# Patient Record
Sex: Male | Born: 1945 | Race: White | Hispanic: No | Marital: Married | Smoking: Never smoker
Health system: Southern US, Community
[De-identification: ages and names within clinical notes are randomized; demographics above are authoritative.]

## PROBLEM LIST (undated history)

## (undated) DIAGNOSIS — I2510 Atherosclerotic heart disease of native coronary artery without angina pectoris: Secondary | ICD-10-CM

## (undated) DIAGNOSIS — E7800 Pure hypercholesterolemia, unspecified: Secondary | ICD-10-CM

## (undated) DIAGNOSIS — N400 Enlarged prostate without lower urinary tract symptoms: Secondary | ICD-10-CM

## (undated) DIAGNOSIS — I4510 Unspecified right bundle-branch block: Secondary | ICD-10-CM

## (undated) DIAGNOSIS — M1990 Unspecified osteoarthritis, unspecified site: Secondary | ICD-10-CM

## (undated) DIAGNOSIS — Z955 Presence of coronary angioplasty implant and graft: Secondary | ICD-10-CM

## (undated) DIAGNOSIS — I519 Heart disease, unspecified: Secondary | ICD-10-CM

## (undated) DIAGNOSIS — C4491 Basal cell carcinoma of skin, unspecified: Secondary | ICD-10-CM

## (undated) DIAGNOSIS — C4492 Squamous cell carcinoma of skin, unspecified: Secondary | ICD-10-CM

## (undated) HISTORY — DX: Unspecified osteoarthritis, unspecified site: M19.90

## (undated) HISTORY — PX: CARDIAC CATHETERIZATION: SHX172

## (undated) HISTORY — DX: Heart disease, unspecified: I51.9

## (undated) HISTORY — DX: Presence of coronary angioplasty implant and graft: Z95.5

## (undated) HISTORY — PX: OTHER SURGICAL HISTORY: SHX169

## (undated) HISTORY — PX: MOHS SURGERY: SHX181

## (undated) HISTORY — PX: COLONOSCOPY: SHX174

## (undated) HISTORY — DX: Pure hypercholesterolemia, unspecified: E78.00

---

## 1990-04-08 HISTORY — PX: BACK SURGERY: SHX140

## 2012-04-08 HISTORY — PX: CATARACT EXTRACTION, BILATERAL: SHX1313

## 2016-01-09 DIAGNOSIS — Z0000 Encounter for general adult medical examination without abnormal findings: Secondary | ICD-10-CM | POA: Diagnosis not present

## 2016-01-09 DIAGNOSIS — Z125 Encounter for screening for malignant neoplasm of prostate: Secondary | ICD-10-CM | POA: Diagnosis not present

## 2016-01-09 DIAGNOSIS — E782 Mixed hyperlipidemia: Secondary | ICD-10-CM | POA: Diagnosis not present

## 2016-01-18 DIAGNOSIS — Z0000 Encounter for general adult medical examination without abnormal findings: Secondary | ICD-10-CM | POA: Diagnosis not present

## 2016-01-18 DIAGNOSIS — Z23 Encounter for immunization: Secondary | ICD-10-CM | POA: Diagnosis not present

## 2017-01-14 DIAGNOSIS — Z0000 Encounter for general adult medical examination without abnormal findings: Secondary | ICD-10-CM | POA: Diagnosis not present

## 2017-01-14 DIAGNOSIS — Z125 Encounter for screening for malignant neoplasm of prostate: Secondary | ICD-10-CM | POA: Diagnosis not present

## 2017-01-14 DIAGNOSIS — E782 Mixed hyperlipidemia: Secondary | ICD-10-CM | POA: Diagnosis not present

## 2017-01-20 DIAGNOSIS — Z125 Encounter for screening for malignant neoplasm of prostate: Secondary | ICD-10-CM | POA: Diagnosis not present

## 2017-01-20 DIAGNOSIS — E782 Mixed hyperlipidemia: Secondary | ICD-10-CM | POA: Diagnosis not present

## 2017-01-20 DIAGNOSIS — Z0000 Encounter for general adult medical examination without abnormal findings: Secondary | ICD-10-CM | POA: Diagnosis not present

## 2017-01-20 DIAGNOSIS — I2510 Atherosclerotic heart disease of native coronary artery without angina pectoris: Secondary | ICD-10-CM | POA: Diagnosis not present

## 2017-01-20 DIAGNOSIS — S0101XA Laceration without foreign body of scalp, initial encounter: Secondary | ICD-10-CM | POA: Diagnosis not present

## 2017-01-20 DIAGNOSIS — Z23 Encounter for immunization: Secondary | ICD-10-CM | POA: Diagnosis not present

## 2017-02-03 DIAGNOSIS — R310 Gross hematuria: Secondary | ICD-10-CM | POA: Diagnosis not present

## 2017-02-03 DIAGNOSIS — R300 Dysuria: Secondary | ICD-10-CM | POA: Diagnosis not present

## 2017-02-03 DIAGNOSIS — C44311 Basal cell carcinoma of skin of nose: Secondary | ICD-10-CM | POA: Diagnosis not present

## 2017-02-03 DIAGNOSIS — N3090 Cystitis, unspecified without hematuria: Secondary | ICD-10-CM | POA: Diagnosis not present

## 2017-02-13 DIAGNOSIS — R310 Gross hematuria: Secondary | ICD-10-CM | POA: Diagnosis not present

## 2017-02-26 DIAGNOSIS — R8290 Unspecified abnormal findings in urine: Secondary | ICD-10-CM | POA: Diagnosis not present

## 2017-03-20 DIAGNOSIS — C4441 Basal cell carcinoma of skin of scalp and neck: Secondary | ICD-10-CM | POA: Diagnosis not present

## 2017-03-20 DIAGNOSIS — L570 Actinic keratosis: Secondary | ICD-10-CM | POA: Diagnosis not present

## 2017-03-20 DIAGNOSIS — D225 Melanocytic nevi of trunk: Secondary | ICD-10-CM | POA: Diagnosis not present

## 2017-03-20 DIAGNOSIS — C4442 Squamous cell carcinoma of skin of scalp and neck: Secondary | ICD-10-CM | POA: Diagnosis not present

## 2017-03-20 DIAGNOSIS — C44311 Basal cell carcinoma of skin of nose: Secondary | ICD-10-CM | POA: Diagnosis not present

## 2017-03-20 DIAGNOSIS — X32XXXA Exposure to sunlight, initial encounter: Secondary | ICD-10-CM | POA: Diagnosis not present

## 2017-03-20 DIAGNOSIS — D485 Neoplasm of uncertain behavior of skin: Secondary | ICD-10-CM | POA: Diagnosis not present

## 2017-04-17 DIAGNOSIS — Z961 Presence of intraocular lens: Secondary | ICD-10-CM | POA: Diagnosis not present

## 2017-04-28 DIAGNOSIS — C4442 Squamous cell carcinoma of skin of scalp and neck: Secondary | ICD-10-CM | POA: Diagnosis not present

## 2017-04-28 DIAGNOSIS — C4441 Basal cell carcinoma of skin of scalp and neck: Secondary | ICD-10-CM | POA: Diagnosis not present

## 2017-04-28 DIAGNOSIS — Z85828 Personal history of other malignant neoplasm of skin: Secondary | ICD-10-CM | POA: Diagnosis not present

## 2017-05-05 DIAGNOSIS — C44321 Squamous cell carcinoma of skin of nose: Secondary | ICD-10-CM | POA: Diagnosis not present

## 2017-05-05 DIAGNOSIS — Z85828 Personal history of other malignant neoplasm of skin: Secondary | ICD-10-CM | POA: Diagnosis not present

## 2017-08-21 DIAGNOSIS — Z85828 Personal history of other malignant neoplasm of skin: Secondary | ICD-10-CM | POA: Diagnosis not present

## 2017-08-21 DIAGNOSIS — X32XXXA Exposure to sunlight, initial encounter: Secondary | ICD-10-CM | POA: Diagnosis not present

## 2017-08-21 DIAGNOSIS — Z08 Encounter for follow-up examination after completed treatment for malignant neoplasm: Secondary | ICD-10-CM | POA: Diagnosis not present

## 2017-08-21 DIAGNOSIS — L821 Other seborrheic keratosis: Secondary | ICD-10-CM | POA: Diagnosis not present

## 2017-08-21 DIAGNOSIS — D1801 Hemangioma of skin and subcutaneous tissue: Secondary | ICD-10-CM | POA: Diagnosis not present

## 2017-08-21 DIAGNOSIS — L570 Actinic keratosis: Secondary | ICD-10-CM | POA: Diagnosis not present

## 2018-01-12 DIAGNOSIS — I2510 Atherosclerotic heart disease of native coronary artery without angina pectoris: Secondary | ICD-10-CM | POA: Diagnosis not present

## 2018-01-12 DIAGNOSIS — E782 Mixed hyperlipidemia: Secondary | ICD-10-CM | POA: Diagnosis not present

## 2018-01-12 DIAGNOSIS — Z125 Encounter for screening for malignant neoplasm of prostate: Secondary | ICD-10-CM | POA: Diagnosis not present

## 2018-01-21 DIAGNOSIS — I2510 Atherosclerotic heart disease of native coronary artery without angina pectoris: Secondary | ICD-10-CM | POA: Diagnosis not present

## 2018-01-21 DIAGNOSIS — Z125 Encounter for screening for malignant neoplasm of prostate: Secondary | ICD-10-CM | POA: Diagnosis not present

## 2018-01-21 DIAGNOSIS — E782 Mixed hyperlipidemia: Secondary | ICD-10-CM | POA: Diagnosis not present

## 2018-01-21 DIAGNOSIS — R319 Hematuria, unspecified: Secondary | ICD-10-CM | POA: Diagnosis not present

## 2018-01-21 DIAGNOSIS — Z23 Encounter for immunization: Secondary | ICD-10-CM | POA: Diagnosis not present

## 2018-01-21 DIAGNOSIS — Z0000 Encounter for general adult medical examination without abnormal findings: Secondary | ICD-10-CM | POA: Diagnosis not present

## 2018-01-21 DIAGNOSIS — R140 Abdominal distension (gaseous): Secondary | ICD-10-CM | POA: Diagnosis not present

## 2018-01-22 ENCOUNTER — Other Ambulatory Visit: Payer: Self-pay | Admitting: Internal Medicine

## 2018-01-22 DIAGNOSIS — R140 Abdominal distension (gaseous): Secondary | ICD-10-CM

## 2018-01-30 ENCOUNTER — Ambulatory Visit
Admission: RE | Admit: 2018-01-30 | Discharge: 2018-01-30 | Disposition: A | Discharge status: Home / Self Care | Payer: PPO | Source: Ambulatory Visit | Attending: Internal Medicine | Admitting: Internal Medicine

## 2018-01-30 DIAGNOSIS — I2510 Atherosclerotic heart disease of native coronary artery without angina pectoris: Secondary | ICD-10-CM | POA: Diagnosis not present

## 2018-01-30 DIAGNOSIS — N281 Cyst of kidney, acquired: Secondary | ICD-10-CM | POA: Insufficient documentation

## 2018-01-30 DIAGNOSIS — K429 Umbilical hernia without obstruction or gangrene: Secondary | ICD-10-CM | POA: Diagnosis not present

## 2018-01-30 DIAGNOSIS — M24851 Other specific joint derangements of right hip, not elsewhere classified: Secondary | ICD-10-CM | POA: Diagnosis not present

## 2018-01-30 DIAGNOSIS — R140 Abdominal distension (gaseous): Secondary | ICD-10-CM | POA: Diagnosis not present

## 2018-01-30 DIAGNOSIS — N400 Enlarged prostate without lower urinary tract symptoms: Secondary | ICD-10-CM | POA: Insufficient documentation

## 2018-01-30 DIAGNOSIS — M47896 Other spondylosis, lumbar region: Secondary | ICD-10-CM | POA: Diagnosis not present

## 2018-01-30 DIAGNOSIS — I700 Atherosclerosis of aorta: Secondary | ICD-10-CM | POA: Diagnosis not present

## 2018-01-30 DIAGNOSIS — M24852 Other specific joint derangements of left hip, not elsewhere classified: Secondary | ICD-10-CM | POA: Diagnosis not present

## 2018-01-30 DIAGNOSIS — K449 Diaphragmatic hernia without obstruction or gangrene: Secondary | ICD-10-CM | POA: Diagnosis not present

## 2018-01-30 DIAGNOSIS — K7689 Other specified diseases of liver: Secondary | ICD-10-CM | POA: Diagnosis not present

## 2018-01-30 HISTORY — DX: Basal cell carcinoma of skin, unspecified: C44.91

## 2018-01-30 HISTORY — DX: Squamous cell carcinoma of skin, unspecified: C44.92

## 2018-01-30 MED ORDER — IOPAMIDOL (ISOVUE-300) INJECTION 61%
100.0000 mL | Freq: Once | INTRAVENOUS | Status: AC | PRN
  Administered 2018-01-30: 100 mL via INTRAVENOUS

## 2018-02-02 ENCOUNTER — Ambulatory Visit: Payer: Self-pay | Admitting: Urology

## 2018-02-09 ENCOUNTER — Encounter: Payer: Self-pay | Admitting: Urology

## 2018-02-09 ENCOUNTER — Ambulatory Visit: Payer: PPO | Admitting: Urology

## 2018-02-09 VITALS — BP 167/96 | Ht 69.0 in | Wt 183.2 lb

## 2018-02-09 DIAGNOSIS — R3129 Other microscopic hematuria: Secondary | ICD-10-CM | POA: Diagnosis not present

## 2018-02-09 LAB — URINALYSIS, COMPLETE
Bilirubin, UA: NEGATIVE
Glucose, UA: NEGATIVE
Ketones, UA: NEGATIVE
Nitrite, UA: NEGATIVE
Protein, UA: NEGATIVE
Specific Gravity, UA: 1.015 (ref 1.005–1.030)
Urobilinogen, Ur: 0.2 mg/dL (ref 0.2–1.0)
pH, UA: 5.5 (ref 5.0–7.5)

## 2018-02-09 LAB — MICROSCOPIC EXAMINATION
Bacteria, UA: NONE SEEN
Epithelial Cells (non renal): NONE SEEN /HPF (ref 0–10)

## 2018-02-09 NOTE — Progress Notes (Signed)
02/09/2018 3:00 PM   Fred Perkins 10-05-1945 [id number]  Referring provider: Rusty Aus, MD [facility] [place], [place] [address]  No chief complaint on file.   HPI: Patient is a 72 -year-old Caucasian male who presents today as a referral from Dr. Emily Filbert for microscopic hematuria.    Patient was found to have microscopic hematuria on 01/12/2018 with 4-10 RBC's/hpf.  Patient does have a prior history of gross hematuria last year that lasted a few days, was given Cipro and abated.   He has had a kidney stone 30 years ago and he spontaneously passed it. He has a history of BPH, currently on tamsulosin.     His brother has a history of stones.  He does not have a family history of malignancies of the genitourinary tract or hematuria.   Today, he is having symptoms of frequent urination and nocturia.  Patient denies any gross hematuria, dysuria or suprapubic/flank pain.  Patient denies any fevers, chills, nausea or vomiting.  His UA today is bland.    Contrast of CT on 01/30/2018 revealed the adrenal glands are normal in appearance.  A 7 millimeter cyst is identified in the midpole region of the kidney. There is no hydronephrosis. The course of the ureters are unremarkable bilaterally. The prostate gland impresses upon the base of the bladder and appears enlarged. Otherwise the bladder is unremarkable.  He is not a smoker.  He was exposed to second hand smoke growing up and as a young adult.  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   He has HTN.  He has a high BMI.     PMH: Past Medical History:  Diagnosis Date  . Arthritis   . H/O heart artery stent   . Heart disease   . High cholesterol   . Skin cancer, basal cell   . Squamous cell skin cancer     Surgical History: MOHS surgery, back surgery, cataract extraction   Home Medications:  Allergies as of 02/09/2018   No Known Allergies       Medication List        Accurate as of 02/09/18  3:00 PM. Always use your most recent med list.          simvastatin 40 MG tablet Commonly known as:  ZOCOR Take 40 mg by mouth daily.   tamsulosin 0.4 MG Caps capsule Commonly known as:  FLOMAX Take 0.4 mg by mouth daily.       Allergies: No Known Allergies  Family History: Family History  Problem Relation Age of Onset  . Dementia Father   . Alzheimer's disease Mother     Social History:  reports that he has never smoked. He has never used smokeless tobacco. His alcohol and drug histories are not on file.  ROS: UROLOGY Frequent Urination?: Yes Hard to postpone urination?: No Burning/pain with urination?: No Get up at night to urinate?: Yes Leakage of urine?: No Urine stream starts and stops?: No Trouble starting stream?: No Do you have to strain to urinate?: No Blood in urine?: No Urinary tract infection?: Yes Sexually transmitted disease?: No Injury to kidneys or bladder?: No Painful intercourse?: No Weak stream?: No Erection problems?: No Penile pain?: No  Gastrointestinal Nausea?: No Vomiting?: No Indigestion/heartburn?: No Diarrhea?: No Constipation?: No  Constitutional Fever: No Night sweats?: No Weight loss?: No Fatigue?: No  Skin Skin rash/lesions?: No Itching?: No  Eyes Blurred vision?: No Double vision?:  No  Ears/Nose/Throat Sore throat?: No Sinus problems?: No  Hematologic/Lymphatic Swollen glands?: No Easy bruising?: No  Cardiovascular Leg swelling?: No Chest pain?: No  Respiratory Cough?: No Shortness of breath?: No  Endocrine Excessive thirst?: No  Musculoskeletal Back pain?: No Joint pain?: No  Neurological Headaches?: No Dizziness?: No  Psychologic Depression?: No Anxiety?: No  Physical Exam: BP (!) 167/96 (BP Location: Left Arm, Patient Position: Sitting, Cuff Size: Normal)   Ht 5\' 9"  (1.753 m)   Wt 183 lb 3.2 oz (83.1 kg)   BMI 27.05 kg/m    Constitutional:  Well nourished. Alert and oriented, No acute distress. HEENT: [place] AT, moist mucus membranes.  Trachea midline, no masses. Cardiovascular: No clubbing, cyanosis, or edema. Respiratory: Normal respiratory effort, no increased work of breathing. GI: Abdomen is soft, non tender, non distended, no abdominal masses. Liver and spleen not palpable.  No hernias appreciated.  Stool sample for occult testing is not indicated.   GU: No CVA tenderness.  No bladder fullness or masses.  Patient with uncircumcised phallus.   Foreskin easily retracted Urethral meatus is patent.  No penile discharge. No penile lesions or rashes. Scrotum without lesions, cysts, rashes and/or edema.  Testicles are located scrotally bilaterally. No masses are appreciated in the testicles. Left and right epididymis are normal. Rectal: Patient with  normal sphincter tone. Anus and perineum without scarring or rashes. No rectal masses are appreciated. Prostate is approximately 60 grams, no nodules are appreciated. Seminal vesicles could not be palpated.   Skin: No rashes, bruises or suspicious lesions. Lymph: No cervical or inguinal adenopathy. Neurologic: Grossly intact, no focal deficits, moving all 4 extremities. Psychiatric: Normal mood and affect.  Laboratory Data: No results found for: WBC, HGB, HCT, MCV, PLT  No results found for: CREATININE  No results found for: PSA  No results found for: TESTOSTERONE  No results found for: HGBA1C  No results found for: TSH  No results found for: CHOL, HDL, CHOLHDL, VLDL, LDLCALC  No results found for: AST No results found for: ALT No components found for: ALKALINEPHOPHATASE No components found for: BILIRUBINTOTAL  No results found for: ESTRADIOL   Urinalysis No results found for: COLORURINE, APPEARANCEUR, LABSPEC, PHURINE, GLUCOSEU, HGBUR, BILIRUBINUR, KETONESUR, PROTEINUR, UROBILINOGEN, NITRITE, LEUKOCYTESUR  Pertinent Imaging: CLINICAL DATA:  Pt states  he went to Dr. Sabra Heck for his yearly physical and his urinalysis was abnormal for WBC. Hx BPH. NKI Hx Basal Cell Skin CA and Squamous Cell Skin CA. No hx surg.  EXAM: CT ABDOMEN AND PELVIS WITH CONTRAST  TECHNIQUE: Multidetector CT imaging of the abdomen and pelvis was performed using the standard protocol following bolus administration of intravenous contrast.  CONTRAST:  179mL ISOVUE-300 IOPAMIDOL (ISOVUE-300) INJECTION 61%  COMPARISON:  None.  FINDINGS: Lower chest: There is minimal bibasilar atelectasis. Coronary artery calcifications are present. Heart size is normal.  Hepatobiliary: Numerous small hepatic cysts are present. Gallbladder is present. Liver density appears normal.  Pancreas: Unremarkable. No pancreatic ductal dilatation or surrounding inflammatory changes.  Spleen: Normal in size without focal abnormality.  Adrenals/Urinary Tract: The adrenal glands are normal in appearance. A 7 millimeter cyst is identified in the midpole region of the kidney. There is no hydronephrosis. The course of the ureters are unremarkable bilaterally. The prostate gland impresses upon the base of the bladder and appears enlarged. Otherwise the bladder is unremarkable.  Stomach/Bowel: The stomach and small bowel loops are normal in appearance. There are numerous colonic diverticula particularly involving the descending and sigmoid segments. There  is no evidence for acute inflammatory change. The appendix is well seen and has a normal appearance.  Vascular/Lymphatic: There is atherosclerotic calcification of the abdominal aorta not associated with aneurysm. Although involved by atherosclerosis, there is vascular opacification of the celiac axis, superior mesenteric artery, and inferior mesenteric artery. Normal appearance of the portal venous system and inferior vena cava.  Reproductive: The prostate is enlarged.  Other: Small paraumbilical fat containing hernia.  There is no ascites.  Musculoskeletal: There are degenerative changes in the LOWER lumbar spine. No suspicious lytic or blastic lesions are identified. Degenerative changes are seen in both hips.  IMPRESSION: 1. No evidence for urinary tract obstruction or suspicious mass. Benign RIGHT renal cyst. 2. Enlarged prostate with impression on the base of the bladder. 3. Coronary artery disease. 4.  Aortic atherosclerosis.  (ICD10-I70.0) 5. Small paraumbilical fat containing hernia. 6. Degenerative changes in the spine and hips. 7. Benign liver cysts.   Electronically Signed   By: Nolon Nations M.D.   On: 01/31/2018 15:45 I have independently reviewed the films and the lower ureters were not captured during delayed study as it is not routinely performed during this type of CT scan   Assessment & Plan:    1. Microscopic hematuria Explained to the patient that there are a number of causes that can be associated with blood in the urine, such as stones, BPH, UTI's, damage to the urinary tract and/or cancer. Explained to the patient that the imaging studies performed on 01/30/2018 are not the recommended studies by the AUA for the work-up of blood in the urine.    The imaging studies that were performed lack the detail of excluding some urological tumors.as the lower ureters were not followed down to the pelvis on the image test.  He is a relatively low risk for any ureteral cancer as he is a non-smoker and there was no hydro-on the CT, but there is a small chance that one could exist.  He could choose to undergo the appropriate study (CTU) or choose to undergo a cystoscopy with bilateral RTG's to complete the hematuria work up. He may also choose to undergo an in office cystoscopy/cytology and then if any abnormalities were found and he needed to be taken to the [facility] could be performed at that time He would like to undergo an in-office cysto at this time - he has an enlarged  prostate and may want to consider a procedure in the future  I described how the cysto is is performed, typically in an office setting with a flexible cystoscope. I described the risks, benefits, and possible side effects, the most common of which is a minor amount of blood in the urine and/or burning which usually resolves in 24 to 48 hours.   The patient had the opportunity to ask questions which were answered. Based upon this discussion, the patient is willing to proceed. Therefore, I've ordered: a CT Urogram and cystoscopy.  The patient will return following all of the above for discussion of the results.  - Urinalysis, Complete - CULTURE, URINE COMPREHENSIVE - BUN+Creat   Return for cystoscopy for hematuria/BOO.  These notes generated with voice recognition software. I apologize for typographical errors.  Zara Council, PA-C  [facility] [address] [place]  [place], [place] [address] [phone]

## 2018-02-10 LAB — BUN+CREAT
BUN/Creatinine Ratio: 10 (ref 10–24)
BUN: 12 mg/dL (ref 8–27)
CREATININE: 1.2 mg/dL (ref 0.76–1.27)
GFR, EST AFRICAN AMERICAN: 69 mL/min/{1.73_m2} (ref >59)
GFR, EST NON AFRICAN AMERICAN: 60 mL/min/{1.73_m2} (ref >59)

## 2018-02-11 LAB — CULTURE, URINE COMPREHENSIVE

## 2018-02-18 ENCOUNTER — Ambulatory Visit: Payer: PPO | Admitting: Urology

## 2018-02-18 ENCOUNTER — Other Ambulatory Visit: Payer: Self-pay

## 2018-02-18 ENCOUNTER — Encounter: Payer: Self-pay | Admitting: Urology

## 2018-02-18 VITALS — BP 164/91 | HR 80 | Wt 180.0 lb

## 2018-02-18 DIAGNOSIS — R3129 Other microscopic hematuria: Secondary | ICD-10-CM | POA: Diagnosis not present

## 2018-02-18 LAB — URINALYSIS, COMPLETE
Bilirubin, UA: NEGATIVE
GLUCOSE, UA: NEGATIVE
Ketones, UA: NEGATIVE
Nitrite, UA: NEGATIVE
Specific Gravity, UA: 1.02 (ref 1.005–1.030)
UUROB: 1 mg/dL (ref 0.2–1.0)
pH, UA: 7 (ref 5.0–7.5)

## 2018-02-18 LAB — MICROSCOPIC EXAMINATION
Bacteria, UA: NONE SEEN
Epithelial Cells (non renal): NONE SEEN /hpf (ref 0–10)

## 2018-02-18 NOTE — Progress Notes (Signed)
   02/18/18  CC:  Chief Complaint  Patient presents with  . Cysto    HPI: Microhematuria, refer to [employer] note of 02/09/2018.  Blood pressure (!) 164/91, pulse 80, weight 180 lb (81.6 kg). NED. A&Ox3.   No respiratory distress   Abd soft, NT, ND Normal phallus with bilateral descended testicles  Cystoscopy Procedure Note  Patient identification was confirmed, informed consent was obtained, and patient was prepped using Betadine solution.  Lidocaine jelly was administered per urethral meatus.     Pre-Procedure: - Inspection reveals a normal caliber urethral meatus.  Procedure: The flexible cystoscope was introduced without difficulty - No urethral strictures/lesions are present. - Moderate lateral lobe enlargement prostate, no median lobe - Mild elevation bladder neck - Bilateral ureteral orifices identified - Bladder mucosa  reveals no ulcers, tumors, or lesions - No bladder stones -Mild trabeculation  Retroflexion shows no tumor or intravesical median lobe   Post-Procedure: - Patient tolerated the procedure well  Assessment/ Plan: 72 year old male with microhematuria most likely secondary to BPH.  No bladder mucosal lesions were identified.  He states his lower urinary tract symptoms are mild and he is not interested in any additional medical management or procedures at this time.  Return in about 6 months (around 08/19/2018) for Recheck with Larene Beach.  Abbie Sons, MD

## 2018-04-14 DIAGNOSIS — M189 Osteoarthritis of first carpometacarpal joint, unspecified: Secondary | ICD-10-CM | POA: Diagnosis not present

## 2018-04-24 DIAGNOSIS — D2262 Melanocytic nevi of left upper limb, including shoulder: Secondary | ICD-10-CM | POA: Diagnosis not present

## 2018-04-24 DIAGNOSIS — Z08 Encounter for follow-up examination after completed treatment for malignant neoplasm: Secondary | ICD-10-CM | POA: Diagnosis not present

## 2018-04-24 DIAGNOSIS — L821 Other seborrheic keratosis: Secondary | ICD-10-CM | POA: Diagnosis not present

## 2018-04-24 DIAGNOSIS — D2261 Melanocytic nevi of right upper limb, including shoulder: Secondary | ICD-10-CM | POA: Diagnosis not present

## 2018-04-24 DIAGNOSIS — X32XXXA Exposure to sunlight, initial encounter: Secondary | ICD-10-CM | POA: Diagnosis not present

## 2018-04-24 DIAGNOSIS — L570 Actinic keratosis: Secondary | ICD-10-CM | POA: Diagnosis not present

## 2018-04-24 DIAGNOSIS — Z85828 Personal history of other malignant neoplasm of skin: Secondary | ICD-10-CM | POA: Diagnosis not present

## 2018-04-24 DIAGNOSIS — D225 Melanocytic nevi of trunk: Secondary | ICD-10-CM | POA: Diagnosis not present

## 2018-04-24 DIAGNOSIS — D2271 Melanocytic nevi of right lower limb, including hip: Secondary | ICD-10-CM | POA: Diagnosis not present

## 2018-04-24 DIAGNOSIS — D2272 Melanocytic nevi of left lower limb, including hip: Secondary | ICD-10-CM | POA: Diagnosis not present

## 2018-06-04 DIAGNOSIS — H353131 Nonexudative age-related macular degeneration, bilateral, early dry stage: Secondary | ICD-10-CM | POA: Diagnosis not present

## 2018-06-18 DIAGNOSIS — H43813 Vitreous degeneration, bilateral: Secondary | ICD-10-CM | POA: Diagnosis not present

## 2018-06-18 DIAGNOSIS — H35373 Puckering of macula, bilateral: Secondary | ICD-10-CM | POA: Diagnosis not present

## 2018-06-18 DIAGNOSIS — H3581 Retinal edema: Secondary | ICD-10-CM | POA: Diagnosis not present

## 2018-08-17 DIAGNOSIS — L309 Dermatitis, unspecified: Secondary | ICD-10-CM | POA: Diagnosis not present

## 2018-08-19 ENCOUNTER — Ambulatory Visit: Payer: PPO | Admitting: Urology

## 2018-09-24 ENCOUNTER — Ambulatory Visit: Payer: PPO | Admitting: Urology

## 2018-10-07 NOTE — Progress Notes (Signed)
10/08/2018 3:31 PM   [name] 11-24-1945 [id number]  Referring provider: Rusty Aus, MD [facility] [place],  [place] [address]  Chief Complaint  Patient presents with  . Hematuria    HPI: Mr. Fred Perkins is a 73 year old male with a history of hematuria   History of hematuria Non-smoker.  Contrast CT in 01/2018 with right renal cyst and enlarged prostate.  Cystoscopy in 02/2018 with Dr. Bernardo Heater was NED  He has no urinary complaints at this time.  Patient denies any gross hematuria, dysuria or suprapubic/flank pain.  Patient denies any fevers, chills, nausea or vomiting.   His UA today is negative.     PMH: Past Medical History:  Diagnosis Date  . Arthritis   . H/O heart artery stent   . Heart disease   . High cholesterol   . Skin cancer, basal cell   . Squamous cell skin cancer     Surgical History: MOHS surgery, back surgery, cataract extraction   Home Medications:  Allergies as of 10/08/2018   No Known Allergies     Medication List       Accurate as of October 08, 2018  3:31 PM. If you have any questions, ask your nurse or doctor.        simvastatin 40 MG tablet Commonly known as: ZOCOR Take 40 mg by mouth daily.   tamsulosin 0.4 MG Caps capsule Commonly known as: FLOMAX Take 0.4 mg by mouth daily.       Allergies: No Known Allergies  Family History: Family History  Problem Relation Age of Onset  . Dementia Father   . Alzheimer's disease Mother     Social History:  reports that he has never smoked. He has never used smokeless tobacco. No history on file for alcohol and drug.  ROS: UROLOGY Frequent Urination?: No Hard to postpone urination?: No Burning/pain with urination?: No Get up at night to urinate?: No Leakage of urine?: No Urine stream starts and stops?: No Trouble starting stream?: No Do you have to strain to urinate?: No Blood in urine?: No Urinary tract infection?: No  Sexually transmitted disease?: No Injury to kidneys or bladder?: No Painful intercourse?: No Weak stream?: No Erection problems?: No Penile pain?: No  Gastrointestinal Nausea?: No Vomiting?: No Indigestion/heartburn?: No Diarrhea?: No Constipation?: No  Constitutional Fever: No Night sweats?: No Weight loss?: No Fatigue?: No  Skin Skin rash/lesions?: No Itching?: No  Eyes Blurred vision?: Yes Double vision?: Yes  Ears/Nose/Throat Sore throat?: No Sinus problems?: No  Hematologic/Lymphatic Swollen glands?: No Easy bruising?: No  Cardiovascular Leg swelling?: No Chest pain?: No  Respiratory Cough?: No Shortness of breath?: No  Endocrine Excessive thirst?: No  Musculoskeletal Back pain?: No Joint pain?: Yes  Neurological Headaches?: No Dizziness?: No  Psychologic Depression?: No Anxiety?: No  Physical Exam: BP (!) 148/79 (BP Location: Left Arm, Patient Position: Sitting, Cuff Size: Normal)   Pulse 72   Ht 5\' 9"  (1.753 m)   Wt 175 lb (79.4 kg)   BMI 25.84 kg/m   Constitutional:  Well nourished. Alert and oriented, No acute distress. HEENT: [place] AT, moist mucus membranes.  Trachea midline, no masses. Cardiovascular: No clubbing, cyanosis, or edema. Respiratory: Normal respiratory effort, no increased work of breathing. Neurologic: Grossly intact, no focal deficits, moving all 4 extremities. Psychiatric: Normal mood and affect.  Laboratory Data: No results found for: WBC, HGB, HCT, MCV, PLT  Lab Results  Component Value Date  CREATININE 1.20 02/09/2018    No results found for: PSA  No results found for: TESTOSTERONE  No results found for: HGBA1C  No results found for: TSH  No results found for: CHOL, HDL, CHOLHDL, VLDL, LDLCALC  No results found for: AST No results found for: ALT No components found for: ALKALINEPHOPHATASE No components found for: BILIRUBINTOTAL  No results found for: ESTRADIOL   Urinalysis Negative.  See  Epic. I have reviewed the labs.  Assessment & Plan:    1. History of hematuria Hematuria work up completed in  02/2018- findings positive for right renal cyst and an enlarged prostate  No report of gross hematuria  UA today negative for [facility]  RTC in one year for UA - patient to report any gross hematuria in the interim    Return in about 1 year (around 10/08/2019) for UA and office visit .  These notes generated with voice recognition software. I apologize for typographical errors.  Zara Council, PA-C  [facility] [address] [place]  [place], [place] [address] [phone]

## 2018-10-08 ENCOUNTER — Encounter: Payer: Self-pay | Admitting: Urology

## 2018-10-08 ENCOUNTER — Other Ambulatory Visit: Payer: Self-pay

## 2018-10-08 ENCOUNTER — Ambulatory Visit: Payer: PPO | Admitting: Urology

## 2018-10-08 VITALS — BP 148/79 | HR 72 | Ht 69.0 in | Wt 175.0 lb

## 2018-10-08 DIAGNOSIS — R3129 Other microscopic hematuria: Secondary | ICD-10-CM

## 2018-10-08 LAB — MICROSCOPIC EXAMINATION
Bacteria, UA: NONE SEEN
Epithelial Cells (non renal): NONE SEEN /hpf (ref 0–10)
RBC: NONE SEEN /hpf (ref 0–2)

## 2018-10-08 LAB — URINALYSIS, COMPLETE
Bilirubin, UA: NEGATIVE
Glucose, UA: NEGATIVE
Ketones, UA: NEGATIVE
Leukocytes,UA: NEGATIVE
Nitrite, UA: NEGATIVE
Protein,UA: NEGATIVE
Specific Gravity, UA: >1.03 — ABNORMAL HIGH (ref 1.005–1.030)
Urobilinogen, Ur: 0.2 mg/dL (ref 0.2–1.0)
pH, UA: 5 (ref 5.0–7.5)

## 2018-12-25 DIAGNOSIS — X32XXXA Exposure to sunlight, initial encounter: Secondary | ICD-10-CM | POA: Diagnosis not present

## 2018-12-25 DIAGNOSIS — D2262 Melanocytic nevi of left upper limb, including shoulder: Secondary | ICD-10-CM | POA: Diagnosis not present

## 2018-12-25 DIAGNOSIS — D2271 Melanocytic nevi of right lower limb, including hip: Secondary | ICD-10-CM | POA: Diagnosis not present

## 2018-12-25 DIAGNOSIS — D225 Melanocytic nevi of trunk: Secondary | ICD-10-CM | POA: Diagnosis not present

## 2018-12-25 DIAGNOSIS — D2272 Melanocytic nevi of left lower limb, including hip: Secondary | ICD-10-CM | POA: Diagnosis not present

## 2018-12-25 DIAGNOSIS — D2261 Melanocytic nevi of right upper limb, including shoulder: Secondary | ICD-10-CM | POA: Diagnosis not present

## 2018-12-25 DIAGNOSIS — L821 Other seborrheic keratosis: Secondary | ICD-10-CM | POA: Diagnosis not present

## 2018-12-25 DIAGNOSIS — L570 Actinic keratosis: Secondary | ICD-10-CM | POA: Diagnosis not present

## 2018-12-25 DIAGNOSIS — Z08 Encounter for follow-up examination after completed treatment for malignant neoplasm: Secondary | ICD-10-CM | POA: Diagnosis not present

## 2018-12-25 DIAGNOSIS — Z85828 Personal history of other malignant neoplasm of skin: Secondary | ICD-10-CM | POA: Diagnosis not present

## 2019-01-11 DIAGNOSIS — H43813 Vitreous degeneration, bilateral: Secondary | ICD-10-CM | POA: Diagnosis not present

## 2019-01-11 DIAGNOSIS — H3581 Retinal edema: Secondary | ICD-10-CM | POA: Diagnosis not present

## 2019-01-11 DIAGNOSIS — H35373 Puckering of macula, bilateral: Secondary | ICD-10-CM | POA: Diagnosis not present

## 2019-01-18 DIAGNOSIS — Z125 Encounter for screening for malignant neoplasm of prostate: Secondary | ICD-10-CM | POA: Diagnosis not present

## 2019-01-18 DIAGNOSIS — E782 Mixed hyperlipidemia: Secondary | ICD-10-CM | POA: Diagnosis not present

## 2019-01-18 DIAGNOSIS — R319 Hematuria, unspecified: Secondary | ICD-10-CM | POA: Diagnosis not present

## 2019-01-25 DIAGNOSIS — Z0000 Encounter for general adult medical examination without abnormal findings: Secondary | ICD-10-CM | POA: Diagnosis not present

## 2019-01-25 DIAGNOSIS — I700 Atherosclerosis of aorta: Secondary | ICD-10-CM | POA: Diagnosis not present

## 2019-01-25 DIAGNOSIS — E782 Mixed hyperlipidemia: Secondary | ICD-10-CM | POA: Diagnosis not present

## 2019-01-25 DIAGNOSIS — Z125 Encounter for screening for malignant neoplasm of prostate: Secondary | ICD-10-CM | POA: Diagnosis not present

## 2019-01-25 DIAGNOSIS — Z23 Encounter for immunization: Secondary | ICD-10-CM | POA: Diagnosis not present

## 2019-09-23 DIAGNOSIS — H35371 Puckering of macula, right eye: Secondary | ICD-10-CM | POA: Diagnosis not present

## 2019-10-12 ENCOUNTER — Ambulatory Visit: Payer: PPO | Admitting: Urology

## 2019-10-19 NOTE — Progress Notes (Signed)
10/20/2019 3:50 PM   Fred Perkins February 11, 1946 [id number]  Referring provider: Rusty Aus, MD [facility] [place],  [place] [address]  Chief Complaint  Patient presents with  . Hematuria    HPI: Fred Perkins is a 74 year old male with a history of hematuria   History of hematuria (high risk) Non-smoker.  Contrast CT in 01/2018 with right renal cyst and enlarged prostate.  Cystoscopy in 02/2018 with Dr. Bernardo Heater was NED He has no urinary complaints at this time. He is noting a little slowing of the urinary stream at night. He states he has a rare episode of nocturia where the urine stream is weak. He is still able to empty his bladder and does not have to strain to get the urine out. There is no pain associated with the weak stream. Patient denies any gross hematuria, dysuria or suprapubic/flank pain. Patient denies any fevers, chills, nausea or vomiting.   His UA today is negative.    PMH: Past Medical History:  Diagnosis Date  . Arthritis   . H/O heart artery stent   . Heart disease   . High cholesterol   . Skin cancer, basal cell   . Squamous cell skin cancer     Surgical History: MOHS surgery, back surgery, cataract extraction   Home Medications:  Allergies as of 10/20/2019   No Known Allergies     Medication List       Accurate as of October 20, 2019  3:50 PM. If you have any questions, ask your nurse or doctor.        simvastatin 40 MG tablet Commonly known as: ZOCOR Take 40 mg by mouth daily.   tamsulosin 0.4 MG Caps capsule Commonly known as: FLOMAX Take 0.4 mg by mouth daily.       Allergies: No Known Allergies  Family History: Family History  Problem Relation Age of Onset  . Dementia Father   . Alzheimer's disease Mother     Social History:  reports that he has never smoked. He has never used smokeless tobacco. No history on file for alcohol use and drug use.  ROS: For pertinent review of  systems please refer to history of present illness  Physical Exam: BP (!) 152/81   Pulse 79   Ht 5\' 9"  (1.753 m)   Wt 177 lb 8 oz (80.5 kg)   BMI 26.21 kg/m   Constitutional:  Well nourished. Alert and oriented, No acute distress. HEENT:  AT, mask in place.  Trachea midline Cardiovascular: No clubbing, cyanosis, or edema. Respiratory: Normal respiratory effort, no increased work of breathing. Neurologic: Grossly intact, no focal deficits, moving all 4 extremities. Psychiatric: Normal mood and affect.  Laboratory Data: Component 01/18/19 01/12/18 01/14/17 01/09/16 01/11/15 01/07/14  PSA (Prostate Specific Antigen), Total 1.74 2.04 2.30 2.00 1.65 1.90   Lab Results  Component Value Date   CREATININE 1.20 02/09/2018   Urinalysis Component     Latest Ref Rng & Units 10/20/2019  Specific Gravity, UA     1.005 - 1.030 >1.030 (H)  pH, UA     5.0 - 7.5 5.0  Color, UA     Yellow Yellow  Appearance Ur     Clear Clear  Leukocytes,UA     Negative Negative  Protein,UA     Negative/Trace Negative  Glucose, UA     Negative Negative  Ketones, UA     Negative Negative  RBC, UA  Negative Trace (A)  Bilirubin, UA     Negative Negative  Urobilinogen, Ur     0.2 - 1.0 mg/dL 0.2  Nitrite, UA     Negative Negative  Microscopic Examination      See below:   Component     Latest Ref Rng & Units 10/20/2019  WBC, UA     0 - 5 /hpf 6-10 (A)  RBC     0 - 2 /hpf 0-2  Epithelial Cells (non renal)     0 - 10 /hpf 0-10  Casts     None seen /lpf Present (A)  Cast Type     N/A Hyaline casts  Bacteria, UA     None seen/Few None seen   I have reviewed the labs.  Assessment & Plan:    1. History of hematuria Hematuria work up completed in  02/2018- findings positive for right renal cyst and an enlarged prostate  No report of gross hematuria  UA today negative for [facility]  Patient will continue to have his UA's followed with his PCP, Dr. Sabra Heck - he will alert [place] if the micro  heme returns or if he should experience gross hematuria  2. BPH Patient has been having an infrequent week stream in the night.  It is not bothersome at this time, but he has been speaking with friends who have undergone prostate procedures and he may consider one in the future. I gave him the brochure on the UroLift procedure and patient will contact us if he wants to pursue a bladder outlet procedure  Return if symptoms worsen or fail to improve.  These notes generated with voice recognition software. I apologize for typographical errors.  Zara Council, PA-C  [facility] [address] [place]  [place], [place] [address] [phone]

## 2019-10-20 ENCOUNTER — Ambulatory Visit: Payer: PPO | Admitting: Urology

## 2019-10-20 ENCOUNTER — Encounter: Payer: Self-pay | Admitting: Urology

## 2019-10-20 ENCOUNTER — Other Ambulatory Visit: Payer: Self-pay

## 2019-10-20 VITALS — BP 152/81 | HR 79 | Ht 69.0 in | Wt 177.5 lb

## 2019-10-20 DIAGNOSIS — N138 Other obstructive and reflux uropathy: Secondary | ICD-10-CM | POA: Diagnosis not present

## 2019-10-20 DIAGNOSIS — N401 Enlarged prostate with lower urinary tract symptoms: Secondary | ICD-10-CM | POA: Diagnosis not present

## 2019-10-20 DIAGNOSIS — R3129 Other microscopic hematuria: Secondary | ICD-10-CM | POA: Diagnosis not present

## 2019-10-22 LAB — URINALYSIS, COMPLETE
Bilirubin, UA: NEGATIVE
Glucose, UA: NEGATIVE
Ketones, UA: NEGATIVE
Leukocytes,UA: NEGATIVE
Nitrite, UA: NEGATIVE
Protein,UA: NEGATIVE
Specific Gravity, UA: >1.03 — ABNORMAL HIGH (ref 1.005–1.030)
Urobilinogen, Ur: 0.2 mg/dL (ref 0.2–1.0)
pH, UA: 5 (ref 5.0–7.5)

## 2019-10-22 LAB — MICROSCOPIC EXAMINATION: Bacteria, UA: NONE SEEN

## 2019-12-24 DIAGNOSIS — D2262 Melanocytic nevi of left upper limb, including shoulder: Secondary | ICD-10-CM | POA: Diagnosis not present

## 2019-12-24 DIAGNOSIS — Z85828 Personal history of other malignant neoplasm of skin: Secondary | ICD-10-CM | POA: Diagnosis not present

## 2019-12-24 DIAGNOSIS — L821 Other seborrheic keratosis: Secondary | ICD-10-CM | POA: Diagnosis not present

## 2019-12-24 DIAGNOSIS — L570 Actinic keratosis: Secondary | ICD-10-CM | POA: Diagnosis not present

## 2019-12-24 DIAGNOSIS — D2261 Melanocytic nevi of right upper limb, including shoulder: Secondary | ICD-10-CM | POA: Diagnosis not present

## 2019-12-24 DIAGNOSIS — X32XXXA Exposure to sunlight, initial encounter: Secondary | ICD-10-CM | POA: Diagnosis not present

## 2019-12-24 DIAGNOSIS — D225 Melanocytic nevi of trunk: Secondary | ICD-10-CM | POA: Diagnosis not present

## 2020-01-24 DIAGNOSIS — E782 Mixed hyperlipidemia: Secondary | ICD-10-CM | POA: Diagnosis not present

## 2020-01-24 DIAGNOSIS — Z125 Encounter for screening for malignant neoplasm of prostate: Secondary | ICD-10-CM | POA: Diagnosis not present

## 2020-01-27 DIAGNOSIS — H43813 Vitreous degeneration, bilateral: Secondary | ICD-10-CM | POA: Diagnosis not present

## 2020-01-27 DIAGNOSIS — H35373 Puckering of macula, bilateral: Secondary | ICD-10-CM | POA: Diagnosis not present

## 2020-01-27 DIAGNOSIS — H3581 Retinal edema: Secondary | ICD-10-CM | POA: Diagnosis not present

## 2020-01-31 DIAGNOSIS — Z23 Encounter for immunization: Secondary | ICD-10-CM | POA: Diagnosis not present

## 2020-01-31 DIAGNOSIS — I700 Atherosclerosis of aorta: Secondary | ICD-10-CM | POA: Diagnosis not present

## 2020-01-31 DIAGNOSIS — E782 Mixed hyperlipidemia: Secondary | ICD-10-CM | POA: Diagnosis not present

## 2020-01-31 DIAGNOSIS — Z125 Encounter for screening for malignant neoplasm of prostate: Secondary | ICD-10-CM | POA: Diagnosis not present

## 2020-01-31 DIAGNOSIS — Z0000 Encounter for general adult medical examination without abnormal findings: Secondary | ICD-10-CM | POA: Diagnosis not present

## 2020-02-23 DIAGNOSIS — H35371 Puckering of macula, right eye: Secondary | ICD-10-CM | POA: Diagnosis not present

## 2020-03-09 DIAGNOSIS — H35373 Puckering of macula, bilateral: Secondary | ICD-10-CM | POA: Diagnosis not present

## 2020-03-09 DIAGNOSIS — H3581 Retinal edema: Secondary | ICD-10-CM | POA: Diagnosis not present

## 2020-03-10 DIAGNOSIS — M25512 Pain in left shoulder: Secondary | ICD-10-CM | POA: Diagnosis not present

## 2020-03-10 DIAGNOSIS — M542 Cervicalgia: Secondary | ICD-10-CM | POA: Diagnosis not present

## 2020-03-10 DIAGNOSIS — I739 Peripheral vascular disease, unspecified: Secondary | ICD-10-CM | POA: Diagnosis not present

## 2020-03-10 DIAGNOSIS — M2578 Osteophyte, vertebrae: Secondary | ICD-10-CM | POA: Diagnosis not present

## 2020-03-10 DIAGNOSIS — M25712 Osteophyte, left shoulder: Secondary | ICD-10-CM | POA: Diagnosis not present

## 2020-03-10 DIAGNOSIS — M47812 Spondylosis without myelopathy or radiculopathy, cervical region: Secondary | ICD-10-CM | POA: Diagnosis not present

## 2020-03-10 DIAGNOSIS — M19012 Primary osteoarthritis, left shoulder: Secondary | ICD-10-CM | POA: Diagnosis not present

## 2020-03-29 DIAGNOSIS — H35373 Puckering of macula, bilateral: Secondary | ICD-10-CM | POA: Diagnosis not present

## 2020-06-05 DIAGNOSIS — H43812 Vitreous degeneration, left eye: Secondary | ICD-10-CM | POA: Diagnosis not present

## 2020-06-05 DIAGNOSIS — H35372 Puckering of macula, left eye: Secondary | ICD-10-CM | POA: Diagnosis not present

## 2020-06-20 IMAGING — CT CT ABD-PELV W/ CM
2 of 5 series · 15 of 46 positions shown, 17 images · IV contrast (iopamidol)
Comparison: None.

CLINICAL DATA: Pt states he went to Dr. Grozis for his yearly
physical and his urinalysis was abnormal for WBC. Hx BPH. NKI Hx
Basal Cell Skin CA and Squamous Cell Skin CA. No hx surg.

EXAM:
CT ABDOMEN AND PELVIS WITH CONTRAST
TECHNIQUE: Multidetector CT imaging of the abdomen and pelvis was performed
using the standard protocol following bolus administration of
intravenous contrast.
CONTRAST:  100mL 5IWZKC-III IOPAMIDOL (5IWZKC-III) INJECTION 61%

[Series 2: abd pelvis · axial · 0.74mm/px · z∈[-1563,-1113]mm · 12 of 102 slices shown, 14 images (1 of 2)]
[im 6/102  soft-tissue]
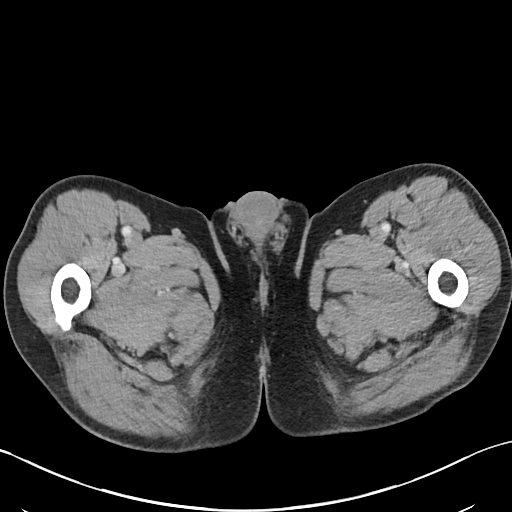
[im 6/102  bone]
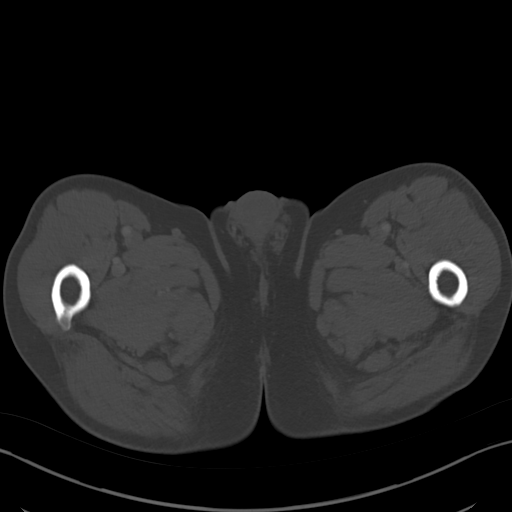
[im 17/102  soft-tissue]
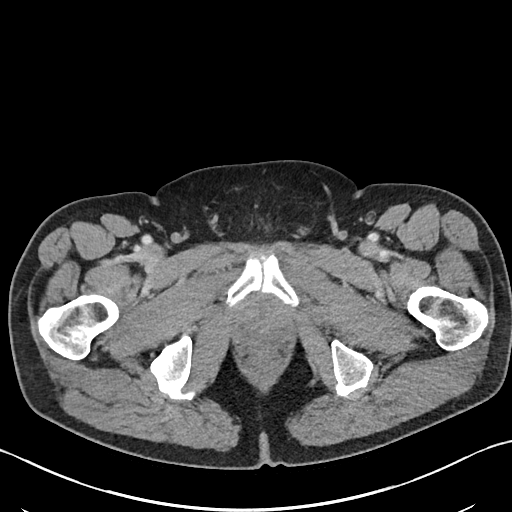
[im 23/102  soft-tissue]
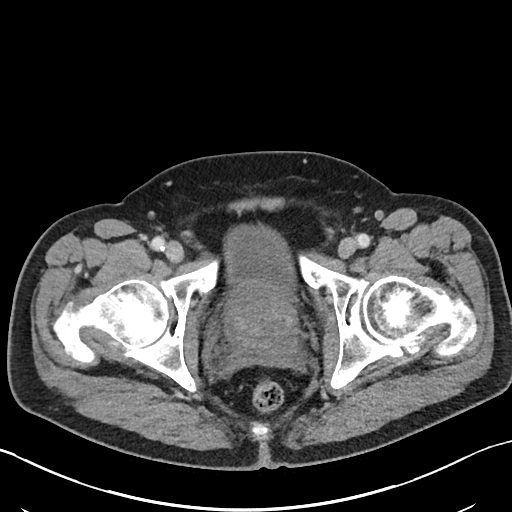
[im 29/102  soft-tissue]
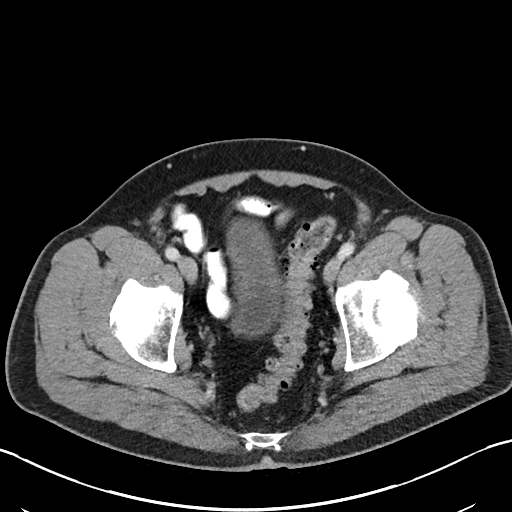
[im 40/102  soft-tissue]
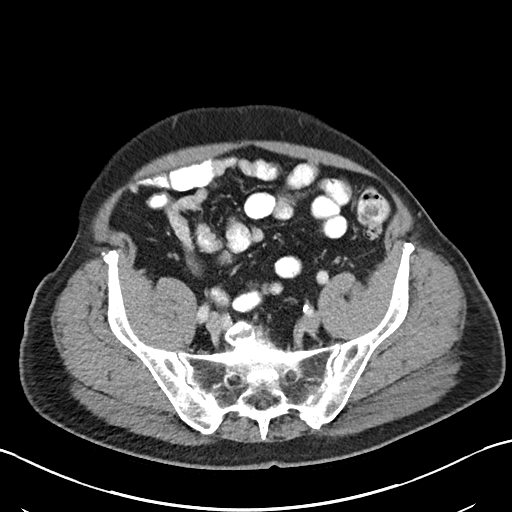
[im 45/102  soft-tissue]
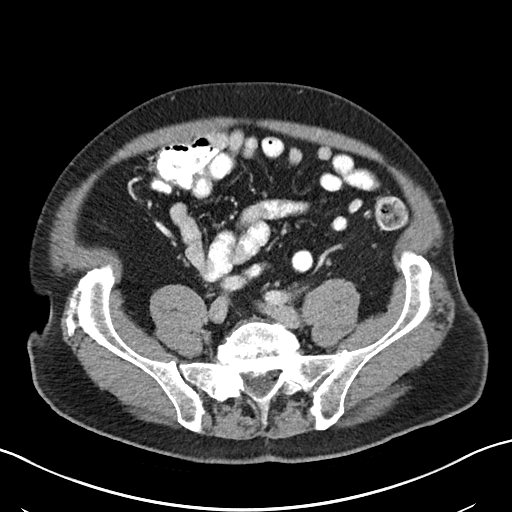
[im 57/102  soft-tissue]
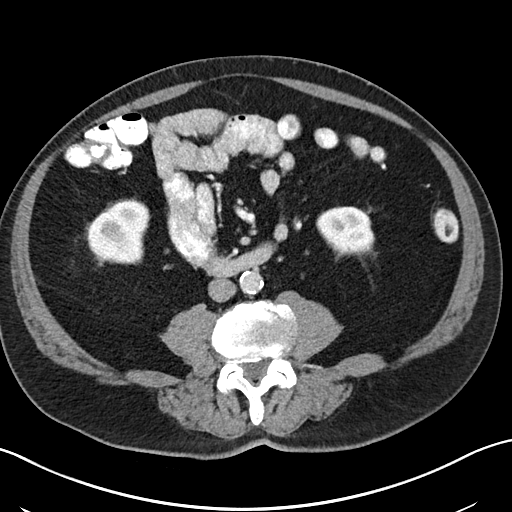
[im 62/102  soft-tissue]
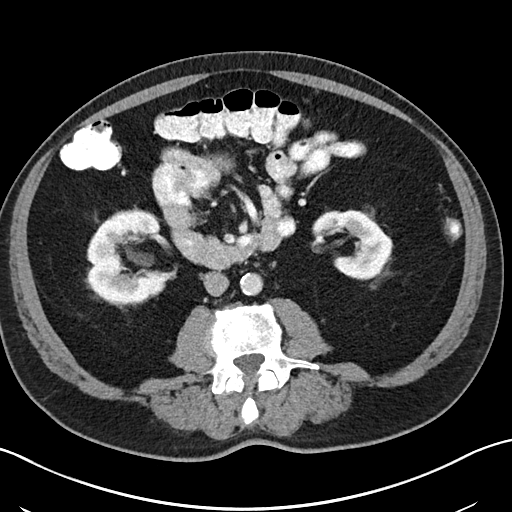
[im 73/102  soft-tissue]
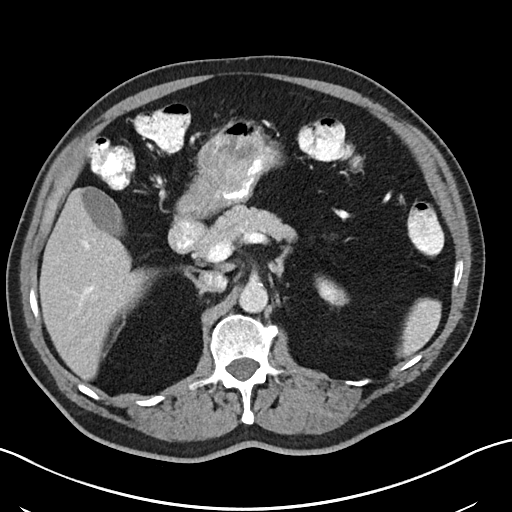
[im 73/102  bone]
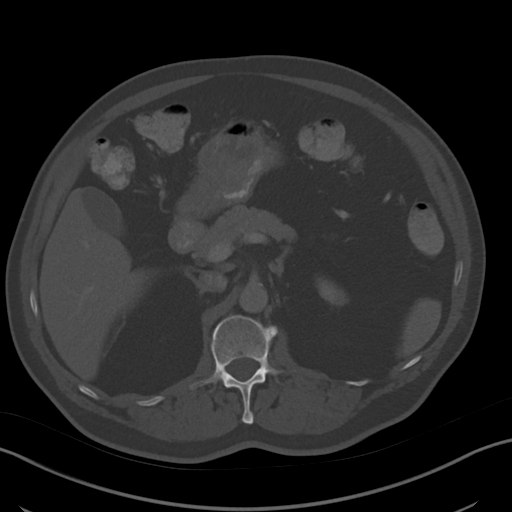
[im 79/102  soft-tissue]
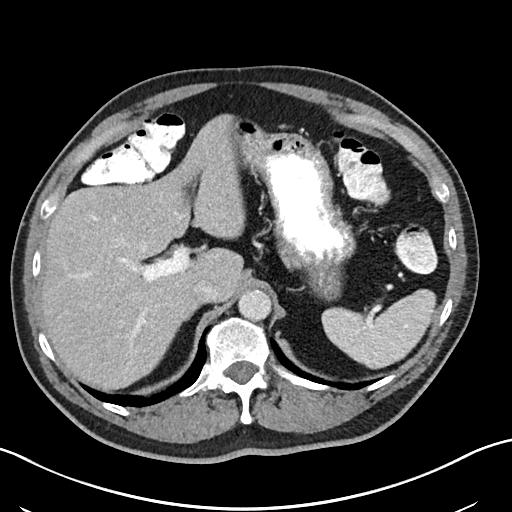
[im 85/102  soft-tissue]
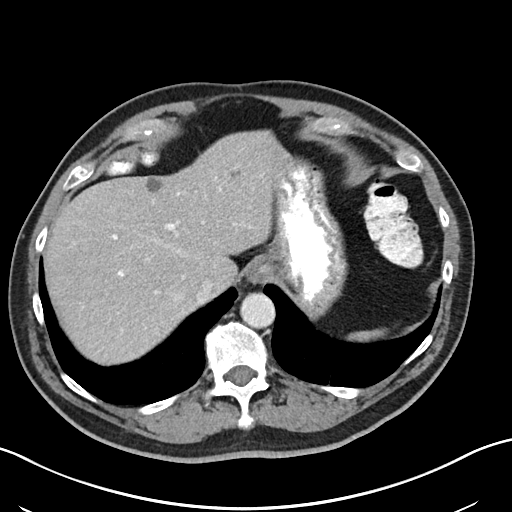
[im 96/102  soft-tissue]
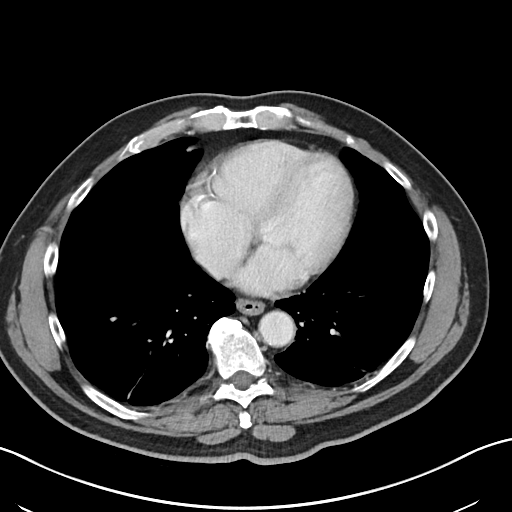

[Series 4: abd pelvis · coronal · 0.74mm/px · 3 of 160 slices shown (2 of 2)]
[im 54/160  soft-tissue]
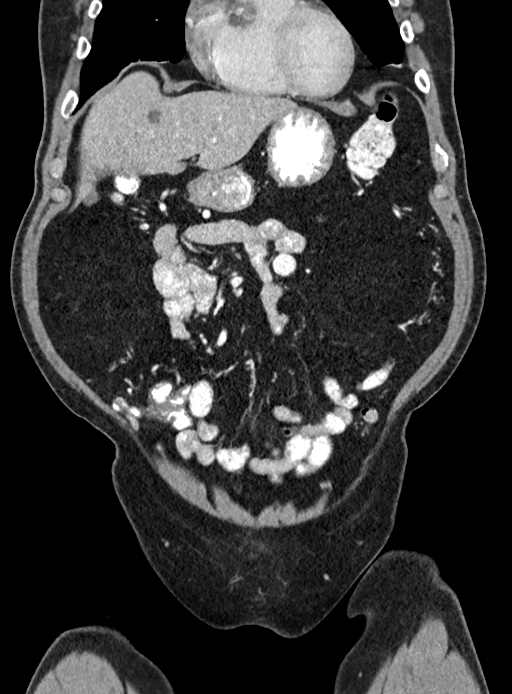
[im 71/160  soft-tissue]
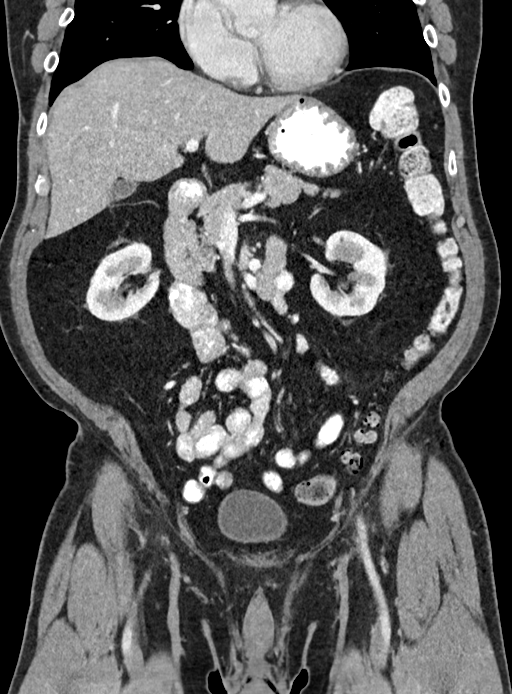
[im 89/160  soft-tissue]
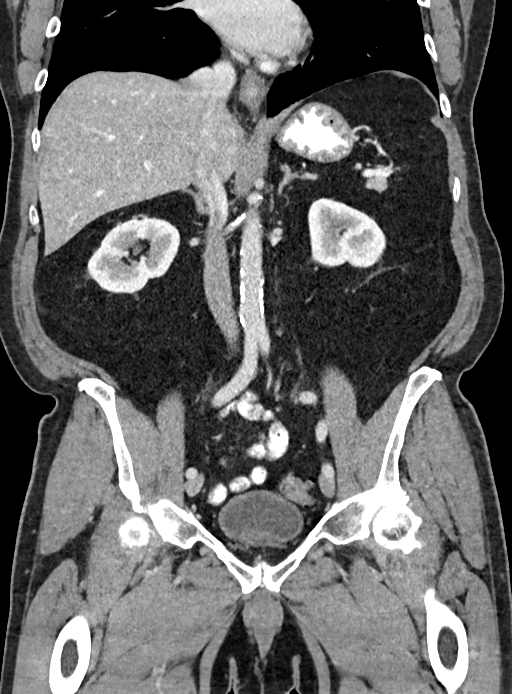

[15 of 46 positions shown; findings below may reference images not displayed]

FINDINGS: Lower chest: There is minimal bibasilar atelectasis. Coronary artery
calcifications are present. Heart size is normal.

Hepatobiliary: Numerous small hepatic cysts are present. Gallbladder
is present. Liver density appears normal.

Pancreas: Unremarkable. No pancreatic ductal dilatation or
surrounding inflammatory changes.

Spleen: Normal in size without focal abnormality.

Adrenals/Urinary Tract: The adrenal glands are normal in appearance.
A 7 millimeter cyst is identified in the midpole region of the
kidney. There is no hydronephrosis. The course of the ureters are
unremarkable bilaterally. The prostate gland impresses upon the base
of the bladder and appears enlarged. Otherwise the bladder is
unremarkable.

Stomach/Bowel: The stomach and small bowel loops are normal in
appearance. There are numerous colonic diverticula particularly
involving the descending and sigmoid segments. There is no evidence
for acute inflammatory change. The appendix is well seen and has a
normal appearance.

Vascular/Lymphatic: There is atherosclerotic calcification of the
abdominal aorta not associated with aneurysm. Although involved by
atherosclerosis, there is vascular opacification of the celiac axis,
superior mesenteric artery, and inferior mesenteric artery. Normal
appearance of the portal venous system and inferior vena cava.

Reproductive: The prostate is enlarged.

Other: Small paraumbilical fat containing hernia. There is no
ascites.

Musculoskeletal: There are degenerative changes in the LOWER lumbar
spine. No suspicious lytic or blastic lesions are identified.
Degenerative changes are seen in both hips.
IMPRESSION: 1. No evidence for urinary tract obstruction or suspicious mass.
Benign RIGHT renal cyst.
2. Enlarged prostate with impression on the base of the bladder.
3. Coronary artery disease.
4.  Aortic atherosclerosis.  ([id number])
5. Small paraumbilical fat containing hernia.
6. Degenerative changes in the spine and hips.
7. Benign liver cysts.

## 2020-06-23 DIAGNOSIS — L300 Nummular dermatitis: Secondary | ICD-10-CM | POA: Diagnosis not present

## 2020-06-23 DIAGNOSIS — D225 Melanocytic nevi of trunk: Secondary | ICD-10-CM | POA: Diagnosis not present

## 2020-06-23 DIAGNOSIS — X32XXXA Exposure to sunlight, initial encounter: Secondary | ICD-10-CM | POA: Diagnosis not present

## 2020-06-23 DIAGNOSIS — Z85828 Personal history of other malignant neoplasm of skin: Secondary | ICD-10-CM | POA: Diagnosis not present

## 2020-06-23 DIAGNOSIS — D2262 Melanocytic nevi of left upper limb, including shoulder: Secondary | ICD-10-CM | POA: Diagnosis not present

## 2020-06-23 DIAGNOSIS — L570 Actinic keratosis: Secondary | ICD-10-CM | POA: Diagnosis not present

## 2020-06-23 DIAGNOSIS — D2271 Melanocytic nevi of right lower limb, including hip: Secondary | ICD-10-CM | POA: Diagnosis not present

## 2020-08-10 DIAGNOSIS — H524 Presbyopia: Secondary | ICD-10-CM | POA: Diagnosis not present

## 2021-01-22 DIAGNOSIS — R7309 Other abnormal glucose: Secondary | ICD-10-CM | POA: Diagnosis not present

## 2021-01-22 DIAGNOSIS — Z125 Encounter for screening for malignant neoplasm of prostate: Secondary | ICD-10-CM | POA: Diagnosis not present

## 2021-01-22 DIAGNOSIS — E782 Mixed hyperlipidemia: Secondary | ICD-10-CM | POA: Diagnosis not present

## 2021-01-31 DIAGNOSIS — Z23 Encounter for immunization: Secondary | ICD-10-CM | POA: Diagnosis not present

## 2021-01-31 DIAGNOSIS — E782 Mixed hyperlipidemia: Secondary | ICD-10-CM | POA: Diagnosis not present

## 2021-01-31 DIAGNOSIS — R739 Hyperglycemia, unspecified: Secondary | ICD-10-CM | POA: Diagnosis not present

## 2021-01-31 DIAGNOSIS — Z0000 Encounter for general adult medical examination without abnormal findings: Secondary | ICD-10-CM | POA: Diagnosis not present

## 2021-01-31 DIAGNOSIS — Z125 Encounter for screening for malignant neoplasm of prostate: Secondary | ICD-10-CM | POA: Diagnosis not present

## 2021-01-31 DIAGNOSIS — I700 Atherosclerosis of aorta: Secondary | ICD-10-CM | POA: Diagnosis not present

## 2021-02-20 DIAGNOSIS — H35373 Puckering of macula, bilateral: Secondary | ICD-10-CM | POA: Diagnosis not present

## 2021-04-10 DIAGNOSIS — K5730 Diverticulosis of large intestine without perforation or abscess without bleeding: Secondary | ICD-10-CM | POA: Diagnosis not present

## 2021-04-10 DIAGNOSIS — Z1211 Encounter for screening for malignant neoplasm of colon: Secondary | ICD-10-CM | POA: Diagnosis not present

## 2021-05-21 DIAGNOSIS — K5730 Diverticulosis of large intestine without perforation or abscess without bleeding: Secondary | ICD-10-CM | POA: Diagnosis not present

## 2021-05-21 DIAGNOSIS — Z1211 Encounter for screening for malignant neoplasm of colon: Secondary | ICD-10-CM | POA: Diagnosis not present

## 2021-05-21 DIAGNOSIS — K640 First degree hemorrhoids: Secondary | ICD-10-CM | POA: Diagnosis not present

## 2021-06-25 DIAGNOSIS — D2271 Melanocytic nevi of right lower limb, including hip: Secondary | ICD-10-CM | POA: Diagnosis not present

## 2021-06-25 DIAGNOSIS — Z85828 Personal history of other malignant neoplasm of skin: Secondary | ICD-10-CM | POA: Diagnosis not present

## 2021-06-25 DIAGNOSIS — L570 Actinic keratosis: Secondary | ICD-10-CM | POA: Diagnosis not present

## 2021-06-25 DIAGNOSIS — D2261 Melanocytic nevi of right upper limb, including shoulder: Secondary | ICD-10-CM | POA: Diagnosis not present

## 2021-06-25 DIAGNOSIS — X32XXXA Exposure to sunlight, initial encounter: Secondary | ICD-10-CM | POA: Diagnosis not present

## 2021-06-25 DIAGNOSIS — D2262 Melanocytic nevi of left upper limb, including shoulder: Secondary | ICD-10-CM | POA: Diagnosis not present

## 2021-08-13 DIAGNOSIS — H35373 Puckering of macula, bilateral: Secondary | ICD-10-CM | POA: Diagnosis not present

## 2021-12-31 DIAGNOSIS — Z85828 Personal history of other malignant neoplasm of skin: Secondary | ICD-10-CM | POA: Diagnosis not present

## 2021-12-31 DIAGNOSIS — Z08 Encounter for follow-up examination after completed treatment for malignant neoplasm: Secondary | ICD-10-CM | POA: Diagnosis not present

## 2021-12-31 DIAGNOSIS — X32XXXA Exposure to sunlight, initial encounter: Secondary | ICD-10-CM | POA: Diagnosis not present

## 2021-12-31 DIAGNOSIS — L570 Actinic keratosis: Secondary | ICD-10-CM | POA: Diagnosis not present

## 2022-01-28 DIAGNOSIS — R739 Hyperglycemia, unspecified: Secondary | ICD-10-CM | POA: Diagnosis not present

## 2022-01-28 DIAGNOSIS — Z125 Encounter for screening for malignant neoplasm of prostate: Secondary | ICD-10-CM | POA: Diagnosis not present

## 2022-01-28 DIAGNOSIS — E782 Mixed hyperlipidemia: Secondary | ICD-10-CM | POA: Diagnosis not present

## 2022-02-05 DIAGNOSIS — I700 Atherosclerosis of aorta: Secondary | ICD-10-CM | POA: Diagnosis not present

## 2022-02-05 DIAGNOSIS — Z125 Encounter for screening for malignant neoplasm of prostate: Secondary | ICD-10-CM | POA: Diagnosis not present

## 2022-02-05 DIAGNOSIS — E782 Mixed hyperlipidemia: Secondary | ICD-10-CM | POA: Diagnosis not present

## 2022-02-05 DIAGNOSIS — Z0000 Encounter for general adult medical examination without abnormal findings: Secondary | ICD-10-CM | POA: Diagnosis not present

## 2022-02-14 DIAGNOSIS — H35373 Puckering of macula, bilateral: Secondary | ICD-10-CM | POA: Diagnosis not present

## 2022-06-17 DIAGNOSIS — E538 Deficiency of other specified B group vitamins: Secondary | ICD-10-CM | POA: Diagnosis not present

## 2022-06-17 DIAGNOSIS — R634 Abnormal weight loss: Secondary | ICD-10-CM | POA: Diagnosis not present

## 2022-06-17 DIAGNOSIS — I700 Atherosclerosis of aorta: Secondary | ICD-10-CM | POA: Diagnosis not present

## 2022-06-17 DIAGNOSIS — M1990 Unspecified osteoarthritis, unspecified site: Secondary | ICD-10-CM | POA: Diagnosis not present

## 2022-07-02 DIAGNOSIS — C44319 Basal cell carcinoma of skin of other parts of face: Secondary | ICD-10-CM | POA: Diagnosis not present

## 2022-07-02 DIAGNOSIS — D485 Neoplasm of uncertain behavior of skin: Secondary | ICD-10-CM | POA: Diagnosis not present

## 2022-07-02 DIAGNOSIS — D2261 Melanocytic nevi of right upper limb, including shoulder: Secondary | ICD-10-CM | POA: Diagnosis not present

## 2022-07-02 DIAGNOSIS — D2271 Melanocytic nevi of right lower limb, including hip: Secondary | ICD-10-CM | POA: Diagnosis not present

## 2022-07-02 DIAGNOSIS — Z85828 Personal history of other malignant neoplasm of skin: Secondary | ICD-10-CM | POA: Diagnosis not present

## 2022-07-02 DIAGNOSIS — D2262 Melanocytic nevi of left upper limb, including shoulder: Secondary | ICD-10-CM | POA: Diagnosis not present

## 2022-07-02 DIAGNOSIS — L570 Actinic keratosis: Secondary | ICD-10-CM | POA: Diagnosis not present

## 2022-07-02 DIAGNOSIS — D225 Melanocytic nevi of trunk: Secondary | ICD-10-CM | POA: Diagnosis not present

## 2022-07-03 DIAGNOSIS — L4050 Arthropathic psoriasis, unspecified: Secondary | ICD-10-CM | POA: Diagnosis not present

## 2022-07-03 DIAGNOSIS — M1990 Unspecified osteoarthritis, unspecified site: Secondary | ICD-10-CM | POA: Diagnosis not present

## 2022-07-03 DIAGNOSIS — D510 Vitamin B12 deficiency anemia due to intrinsic factor deficiency: Secondary | ICD-10-CM | POA: Diagnosis not present

## 2022-07-03 DIAGNOSIS — M25551 Pain in right hip: Secondary | ICD-10-CM | POA: Diagnosis not present

## 2022-07-03 DIAGNOSIS — M25552 Pain in left hip: Secondary | ICD-10-CM | POA: Diagnosis not present

## 2022-07-10 ENCOUNTER — Other Ambulatory Visit: Payer: Self-pay

## 2022-07-10 ENCOUNTER — Encounter: Payer: Self-pay | Admitting: Emergency Medicine

## 2022-07-10 DIAGNOSIS — K922 Gastrointestinal hemorrhage, unspecified: Secondary | ICD-10-CM | POA: Diagnosis present

## 2022-07-10 DIAGNOSIS — Z888 Allergy status to other drugs, medicaments and biological substances status: Secondary | ICD-10-CM

## 2022-07-10 DIAGNOSIS — R55 Syncope and collapse: Secondary | ICD-10-CM | POA: Diagnosis present

## 2022-07-10 DIAGNOSIS — Z79899 Other long term (current) drug therapy: Secondary | ICD-10-CM

## 2022-07-10 DIAGNOSIS — D500 Iron deficiency anemia secondary to blood loss (chronic): Secondary | ICD-10-CM | POA: Diagnosis not present

## 2022-07-10 DIAGNOSIS — D649 Anemia, unspecified: Secondary | ICD-10-CM | POA: Diagnosis not present

## 2022-07-10 DIAGNOSIS — K5730 Diverticulosis of large intestine without perforation or abscess without bleeding: Secondary | ICD-10-CM | POA: Diagnosis present

## 2022-07-10 DIAGNOSIS — E538 Deficiency of other specified B group vitamins: Secondary | ICD-10-CM | POA: Diagnosis present

## 2022-07-10 DIAGNOSIS — Z86010 Personal history of colonic polyps: Secondary | ICD-10-CM | POA: Diagnosis not present

## 2022-07-10 DIAGNOSIS — K317 Polyp of stomach and duodenum: Secondary | ICD-10-CM | POA: Diagnosis not present

## 2022-07-10 DIAGNOSIS — K2960 Other gastritis without bleeding: Secondary | ICD-10-CM | POA: Diagnosis not present

## 2022-07-10 DIAGNOSIS — K2970 Gastritis, unspecified, without bleeding: Secondary | ICD-10-CM | POA: Diagnosis present

## 2022-07-10 DIAGNOSIS — T39395A Adverse effect of other nonsteroidal anti-inflammatory drugs [NSAID], initial encounter: Secondary | ICD-10-CM | POA: Diagnosis present

## 2022-07-10 DIAGNOSIS — Z818 Family history of other mental and behavioral disorders: Secondary | ICD-10-CM | POA: Diagnosis not present

## 2022-07-10 DIAGNOSIS — I452 Bifascicular block: Secondary | ICD-10-CM | POA: Diagnosis present

## 2022-07-10 DIAGNOSIS — K222 Esophageal obstruction: Secondary | ICD-10-CM | POA: Diagnosis present

## 2022-07-10 DIAGNOSIS — K449 Diaphragmatic hernia without obstruction or gangrene: Secondary | ICD-10-CM | POA: Diagnosis present

## 2022-07-10 DIAGNOSIS — E861 Hypovolemia: Secondary | ICD-10-CM | POA: Diagnosis present

## 2022-07-10 DIAGNOSIS — K264 Chronic or unspecified duodenal ulcer with hemorrhage: Principal | ICD-10-CM | POA: Diagnosis present

## 2022-07-10 DIAGNOSIS — K7689 Other specified diseases of liver: Secondary | ICD-10-CM | POA: Diagnosis not present

## 2022-07-10 DIAGNOSIS — E785 Hyperlipidemia, unspecified: Secondary | ICD-10-CM | POA: Diagnosis not present

## 2022-07-10 DIAGNOSIS — N400 Enlarged prostate without lower urinary tract symptoms: Secondary | ICD-10-CM | POA: Diagnosis present

## 2022-07-10 DIAGNOSIS — M1990 Unspecified osteoarthritis, unspecified site: Secondary | ICD-10-CM | POA: Diagnosis present

## 2022-07-10 DIAGNOSIS — K269 Duodenal ulcer, unspecified as acute or chronic, without hemorrhage or perforation: Secondary | ICD-10-CM | POA: Diagnosis not present

## 2022-07-10 DIAGNOSIS — Z820 Family history of epilepsy and other diseases of the nervous system: Secondary | ICD-10-CM | POA: Diagnosis not present

## 2022-07-10 DIAGNOSIS — K648 Other hemorrhoids: Secondary | ICD-10-CM | POA: Diagnosis present

## 2022-07-10 DIAGNOSIS — D62 Acute posthemorrhagic anemia: Secondary | ICD-10-CM | POA: Diagnosis present

## 2022-07-10 DIAGNOSIS — I2510 Atherosclerotic heart disease of native coronary artery without angina pectoris: Secondary | ICD-10-CM | POA: Diagnosis present

## 2022-07-10 DIAGNOSIS — E7800 Pure hypercholesterolemia, unspecified: Secondary | ICD-10-CM | POA: Diagnosis present

## 2022-07-10 DIAGNOSIS — Z85828 Personal history of other malignant neoplasm of skin: Secondary | ICD-10-CM | POA: Diagnosis not present

## 2022-07-10 DIAGNOSIS — Z955 Presence of coronary angioplasty implant and graft: Secondary | ICD-10-CM

## 2022-07-10 DIAGNOSIS — K921 Melena: Secondary | ICD-10-CM | POA: Diagnosis not present

## 2022-07-10 NOTE — ED Triage Notes (Signed)
Pt to triage via w/c with no distress noted; reports mid abd pain for several days, nonradiating; recently rx protonix by PCP; tonight began having rectal bleeding; denies hx of same; st syncopal episode PTA as well

## 2022-07-11 ENCOUNTER — Encounter: Payer: Self-pay | Admitting: Radiology

## 2022-07-11 ENCOUNTER — Inpatient Hospital Stay: Payer: PPO | Admitting: Anesthesiology

## 2022-07-11 ENCOUNTER — Encounter
Admission: EM | Disposition: A | Discharge status: Home / Self Care | Payer: Self-pay | Source: Home / Self Care | Attending: Internal Medicine

## 2022-07-11 ENCOUNTER — Emergency Department: Payer: PPO

## 2022-07-11 ENCOUNTER — Inpatient Hospital Stay
Admission: EM | Admit: 2022-07-11 | Discharge: 2022-07-13 | DRG: 378 | Disposition: A | Discharge status: Home / Self Care | Payer: PPO | Attending: Internal Medicine | Admitting: Internal Medicine

## 2022-07-11 DIAGNOSIS — R55 Syncope and collapse: Secondary | ICD-10-CM | POA: Diagnosis present

## 2022-07-11 DIAGNOSIS — I2510 Atherosclerotic heart disease of native coronary artery without angina pectoris: Secondary | ICD-10-CM | POA: Diagnosis present

## 2022-07-11 DIAGNOSIS — I452 Bifascicular block: Secondary | ICD-10-CM | POA: Diagnosis present

## 2022-07-11 DIAGNOSIS — T39395A Adverse effect of other nonsteroidal anti-inflammatory drugs [NSAID], initial encounter: Secondary | ICD-10-CM | POA: Diagnosis present

## 2022-07-11 DIAGNOSIS — K648 Other hemorrhoids: Secondary | ICD-10-CM | POA: Diagnosis present

## 2022-07-11 DIAGNOSIS — E538 Deficiency of other specified B group vitamins: Secondary | ICD-10-CM | POA: Diagnosis present

## 2022-07-11 DIAGNOSIS — K317 Polyp of stomach and duodenum: Secondary | ICD-10-CM | POA: Diagnosis not present

## 2022-07-11 DIAGNOSIS — D649 Anemia, unspecified: Secondary | ICD-10-CM

## 2022-07-11 DIAGNOSIS — Z85828 Personal history of other malignant neoplasm of skin: Secondary | ICD-10-CM | POA: Diagnosis not present

## 2022-07-11 DIAGNOSIS — K921 Melena: Secondary | ICD-10-CM

## 2022-07-11 DIAGNOSIS — K2970 Gastritis, unspecified, without bleeding: Secondary | ICD-10-CM | POA: Diagnosis present

## 2022-07-11 DIAGNOSIS — N400 Enlarged prostate without lower urinary tract symptoms: Secondary | ICD-10-CM | POA: Diagnosis present

## 2022-07-11 DIAGNOSIS — Z79899 Other long term (current) drug therapy: Secondary | ICD-10-CM | POA: Diagnosis not present

## 2022-07-11 DIAGNOSIS — E785 Hyperlipidemia, unspecified: Secondary | ICD-10-CM | POA: Diagnosis present

## 2022-07-11 DIAGNOSIS — K222 Esophageal obstruction: Secondary | ICD-10-CM | POA: Diagnosis present

## 2022-07-11 DIAGNOSIS — Z818 Family history of other mental and behavioral disorders: Secondary | ICD-10-CM | POA: Diagnosis not present

## 2022-07-11 DIAGNOSIS — K922 Gastrointestinal hemorrhage, unspecified: Secondary | ICD-10-CM | POA: Diagnosis present

## 2022-07-11 DIAGNOSIS — E7800 Pure hypercholesterolemia, unspecified: Secondary | ICD-10-CM | POA: Diagnosis present

## 2022-07-11 DIAGNOSIS — K449 Diaphragmatic hernia without obstruction or gangrene: Secondary | ICD-10-CM | POA: Diagnosis present

## 2022-07-11 DIAGNOSIS — Z86010 Personal history of colonic polyps: Secondary | ICD-10-CM | POA: Diagnosis not present

## 2022-07-11 DIAGNOSIS — E861 Hypovolemia: Secondary | ICD-10-CM | POA: Diagnosis present

## 2022-07-11 DIAGNOSIS — D62 Acute posthemorrhagic anemia: Secondary | ICD-10-CM | POA: Diagnosis present

## 2022-07-11 DIAGNOSIS — M1990 Unspecified osteoarthritis, unspecified site: Secondary | ICD-10-CM | POA: Diagnosis present

## 2022-07-11 DIAGNOSIS — K5730 Diverticulosis of large intestine without perforation or abscess without bleeding: Secondary | ICD-10-CM | POA: Diagnosis present

## 2022-07-11 DIAGNOSIS — K264 Chronic or unspecified duodenal ulcer with hemorrhage: Secondary | ICD-10-CM | POA: Diagnosis present

## 2022-07-11 DIAGNOSIS — Z888 Allergy status to other drugs, medicaments and biological substances status: Secondary | ICD-10-CM | POA: Diagnosis not present

## 2022-07-11 DIAGNOSIS — Z955 Presence of coronary angioplasty implant and graft: Secondary | ICD-10-CM | POA: Diagnosis not present

## 2022-07-11 DIAGNOSIS — K2960 Other gastritis without bleeding: Secondary | ICD-10-CM | POA: Diagnosis not present

## 2022-07-11 DIAGNOSIS — Z820 Family history of epilepsy and other diseases of the nervous system: Secondary | ICD-10-CM | POA: Diagnosis not present

## 2022-07-11 HISTORY — DX: Benign prostatic hyperplasia without lower urinary tract symptoms: N40.0

## 2022-07-11 HISTORY — PX: ESOPHAGOGASTRODUODENOSCOPY (EGD) WITH PROPOFOL: SHX5813

## 2022-07-11 LAB — URINALYSIS, ROUTINE W REFLEX MICROSCOPIC
Bacteria, UA: NONE SEEN
Bilirubin Urine: NEGATIVE
Glucose, UA: NEGATIVE mg/dL
Ketones, ur: NEGATIVE mg/dL
Leukocytes,Ua: NEGATIVE
Nitrite: NEGATIVE
Protein, ur: NEGATIVE mg/dL
Specific Gravity, Urine: 1.031 — ABNORMAL HIGH (ref 1.005–1.030)
Squamous Epithelial / HPF: NONE SEEN /HPF (ref 0–5)
pH: 5 (ref 5.0–8.0)

## 2022-07-11 LAB — CBC WITH DIFFERENTIAL/PLATELET
Abs Immature Granulocytes: 0.09 10*3/uL — ABNORMAL HIGH (ref 0.00–0.07)
Basophils Absolute: 0 10*3/uL (ref 0.0–0.1)
Basophils Relative: 0 %
Eosinophils Absolute: 0.1 10*3/uL (ref 0.0–0.5)
Eosinophils Relative: 1 %
HCT: 30.8 % — ABNORMAL LOW (ref 39.0–52.0)
Hemoglobin: 9.7 g/dL — ABNORMAL LOW (ref 13.0–17.0)
Immature Granulocytes: 1 %
Lymphocytes Relative: 16 %
Lymphs Abs: 1.6 10*3/uL (ref 0.7–4.0)
MCH: 31.7 pg (ref 26.0–34.0)
MCHC: 31.5 g/dL (ref 30.0–36.0)
MCV: 100.7 fL — ABNORMAL HIGH (ref 80.0–100.0)
Monocytes Absolute: 0.8 10*3/uL (ref 0.1–1.0)
Monocytes Relative: 8 %
Neutro Abs: 7.6 10*3/uL (ref 1.7–7.7)
Neutrophils Relative %: 74 %
Platelets: 278 10*3/uL (ref 150–400)
RBC: 3.06 MIL/uL — ABNORMAL LOW (ref 4.22–5.81)
RDW: 13 % (ref 11.5–15.5)
WBC: 10.2 10*3/uL (ref 4.0–10.5)
nRBC: 0 % (ref 0.0–0.2)

## 2022-07-11 LAB — COMPREHENSIVE METABOLIC PANEL
ALT: 14 U/L (ref 0–44)
AST: 16 U/L (ref 15–41)
Albumin: 3 g/dL — ABNORMAL LOW (ref 3.5–5.0)
Alkaline Phosphatase: 40 U/L (ref 38–126)
Anion gap: 7 (ref 5–15)
BUN: 54 mg/dL — ABNORMAL HIGH (ref 8–23)
CO2: 20 mmol/L — ABNORMAL LOW (ref 22–32)
Calcium: 8.3 mg/dL — ABNORMAL LOW (ref 8.9–10.3)
Chloride: 108 mmol/L (ref 98–111)
Creatinine, Ser: 1.15 mg/dL (ref 0.61–1.24)
GFR, Estimated: >60 mL/min (ref >60)
Glucose, Bld: 126 mg/dL — ABNORMAL HIGH (ref 70–99)
Potassium: 4.7 mmol/L (ref 3.5–5.1)
Sodium: 135 mmol/L (ref 135–145)
Total Bilirubin: 0.9 mg/dL (ref 0.3–1.2)
Total Protein: 5.7 g/dL — ABNORMAL LOW (ref 6.5–8.1)

## 2022-07-11 LAB — CBC
HCT: 26.1 % — ABNORMAL LOW (ref 39.0–52.0)
HCT: 25 % — ABNORMAL LOW (ref 39.0–52.0)
HCT: 22.8 % — ABNORMAL LOW (ref 39.0–52.0)
Hemoglobin: 8.4 g/dL — ABNORMAL LOW (ref 13.0–17.0)
Hemoglobin: 8 g/dL — ABNORMAL LOW (ref 13.0–17.0)
Hemoglobin: 7.4 g/dL — ABNORMAL LOW (ref 13.0–17.0)
MCH: 31.8 pg (ref 26.0–34.0)
MCH: 31.5 pg (ref 26.0–34.0)
MCH: 31.9 pg (ref 26.0–34.0)
MCHC: 32.2 g/dL (ref 30.0–36.0)
MCHC: 32 g/dL (ref 30.0–36.0)
MCHC: 32.5 g/dL (ref 30.0–36.0)
MCV: 98.9 fL (ref 80.0–100.0)
MCV: 98.4 fL (ref 80.0–100.0)
MCV: 98.3 fL (ref 80.0–100.0)
Platelets: 236 10*3/uL (ref 150–400)
Platelets: 222 10*3/uL (ref 150–400)
Platelets: 218 10*3/uL (ref 150–400)
RBC: 2.64 MIL/uL — ABNORMAL LOW (ref 4.22–5.81)
RBC: 2.54 MIL/uL — ABNORMAL LOW (ref 4.22–5.81)
RBC: 2.32 MIL/uL — ABNORMAL LOW (ref 4.22–5.81)
RDW: 13 % (ref 11.5–15.5)
RDW: 13.2 % (ref 11.5–15.5)
RDW: 13.2 % (ref 11.5–15.5)
WBC: 8.7 10*3/uL (ref 4.0–10.5)
WBC: 11.5 10*3/uL — ABNORMAL HIGH (ref 4.0–10.5)
WBC: 11.1 10*3/uL — ABNORMAL HIGH (ref 4.0–10.5)
nRBC: 0 % (ref 0.0–0.2)
nRBC: 0 % (ref 0.0–0.2)
nRBC: 0 % (ref 0.0–0.2)

## 2022-07-11 LAB — FERRITIN: Ferritin: 138 ng/mL (ref 24–336)

## 2022-07-11 LAB — ABO/RH: ABO/RH(D): A POS

## 2022-07-11 LAB — RETICULOCYTES
Immature Retic Fract: 23.7 % — ABNORMAL HIGH (ref 2.3–15.9)
RBC.: 3.07 MIL/uL — ABNORMAL LOW (ref 4.22–5.81)
Retic Count, Absolute: 65.1 10*3/uL (ref 19.0–186.0)
Retic Ct Pct: 2.1 % (ref 0.4–3.1)

## 2022-07-11 LAB — VITAMIN B12: Vitamin B-12: 119 pg/mL — ABNORMAL LOW (ref 180–914)

## 2022-07-11 LAB — FOLATE: Folate: 8 ng/mL (ref >5.9)

## 2022-07-11 LAB — TROPONIN I (HIGH SENSITIVITY): Troponin I (High Sensitivity): 10 ng/L (ref <18)

## 2022-07-11 LAB — PREPARE RBC (CROSSMATCH)

## 2022-07-11 LAB — IRON AND TIBC
Iron: 79 ug/dL (ref 45–182)
Saturation Ratios: 24 % (ref 17.9–39.5)
TIBC: 328 ug/dL (ref 250–450)
UIBC: 249 ug/dL

## 2022-07-11 LAB — LIPASE, BLOOD: Lipase: 37 U/L (ref 11–51)

## 2022-07-11 LAB — PROTIME-INR
INR: 1.2 (ref 0.8–1.2)
Prothrombin Time: 14.7 seconds (ref 11.4–15.2)

## 2022-07-11 LAB — APTT: aPTT: 24 seconds (ref 24–36)

## 2022-07-11 SURGERY — ESOPHAGOGASTRODUODENOSCOPY (EGD) WITH PROPOFOL
Anesthesia: General

## 2022-07-11 MED ORDER — PROPOFOL 10 MG/ML IV BOLUS
INTRAVENOUS | Status: DC | PRN
  Administered 2022-07-11: 70 mg via INTRAVENOUS

## 2022-07-11 MED ORDER — ACETAMINOPHEN 325 MG PO TABS: 650.0000 mg | ORAL_TABLET | Freq: Four times a day (QID) | ORAL | Status: DC | PRN

## 2022-07-11 MED ORDER — SODIUM CHLORIDE 0.9 % IV BOLUS
500.0000 mL | Freq: Once | INTRAVENOUS | Status: AC
  Administered 2022-07-11: 500 mL via INTRAVENOUS

## 2022-07-11 MED ORDER — PANTOPRAZOLE INFUSION (NEW) - SIMPLE MED
8.0000 mg/h | INTRAVENOUS | Status: DC
  Administered 2022-07-11 – 2022-07-12 (×4): 8 mg/h via INTRAVENOUS
  Filled 2022-07-11 (×3): qty 100

## 2022-07-11 MED ORDER — SODIUM CHLORIDE 0.9 % IV SOLN
1.0000 g | INTRAVENOUS | Status: AC
  Administered 2022-07-11: 1 g via INTRAVENOUS
  Filled 2022-07-11: qty 10

## 2022-07-11 MED ORDER — PROPOFOL 500 MG/50ML IV EMUL
INTRAVENOUS | Status: DC | PRN
  Administered 2022-07-11: 145 ug/kg/min via INTRAVENOUS

## 2022-07-11 MED ORDER — ONDANSETRON HCL 4 MG/2ML IJ SOLN: 4.0000 mg | Freq: Three times a day (TID) | INTRAMUSCULAR | Status: DC | PRN

## 2022-07-11 MED ORDER — SODIUM CHLORIDE 0.9 % IV SOLN: INTRAVENOUS | Status: DC

## 2022-07-11 MED ORDER — SODIUM CHLORIDE 0.9% IV SOLUTION: Freq: Once | INTRAVENOUS | Status: AC

## 2022-07-11 MED ORDER — SODIUM CHLORIDE 0.9 % IV SOLN
250.0000 mg | Freq: Once | INTRAVENOUS | Status: DC
  Filled 2022-07-11: qty 5

## 2022-07-11 MED ORDER — IOHEXOL 350 MG/ML SOLN
100.0000 mL | Freq: Once | INTRAVENOUS | Status: AC | PRN
  Administered 2022-07-11: 100 mL via INTRAVENOUS

## 2022-07-11 MED ORDER — LIDOCAINE HCL (CARDIAC) PF 100 MG/5ML IV SOSY
PREFILLED_SYRINGE | INTRAVENOUS | Status: DC | PRN
  Administered 2022-07-11: 100 mg via INTRAVENOUS

## 2022-07-11 MED ORDER — SIMVASTATIN 20 MG PO TABS
40.0000 mg | ORAL_TABLET | Freq: Every day | ORAL | Status: DC
  Administered 2022-07-11 – 2022-07-13 (×3): 40 mg via ORAL
  Filled 2022-07-11 (×3): qty 2

## 2022-07-11 MED ORDER — TAMSULOSIN HCL 0.4 MG PO CAPS
0.4000 mg | ORAL_CAPSULE | Freq: Every day | ORAL | Status: DC
  Administered 2022-07-11 – 2022-07-13 (×3): 0.4 mg via ORAL
  Filled 2022-07-11 (×3): qty 1

## 2022-07-11 MED ORDER — VANCOMYCIN HCL IN DEXTROSE 1-5 GM/200ML-% IV SOLN: 1000.0000 mg | Freq: Once | INTRAVENOUS | Status: DC

## 2022-07-11 MED ORDER — PANTOPRAZOLE 80MG IVPB - SIMPLE MED
80.0000 mg | Freq: Once | INTRAVENOUS | Status: AC
  Administered 2022-07-11: 80 mg via INTRAVENOUS
  Filled 2022-07-11: qty 100

## 2022-07-11 MED ORDER — VASOPRESSIN 20 UNIT/ML IV SOLN
INTRAVENOUS | Status: DC | PRN
  Administered 2022-07-11: 1 [IU] via INTRAVENOUS

## 2022-07-11 MED ORDER — OXYCODONE HCL 5 MG PO TABS: 5.0000 mg | ORAL_TABLET | Freq: Four times a day (QID) | ORAL | Status: DC | PRN

## 2022-07-11 MED ORDER — CLINDAMYCIN PHOSPHATE 600 MG/50ML IV SOLN
600.0000 mg | Freq: Once | INTRAVENOUS | Status: DC
  Filled 2022-07-11: qty 50

## 2022-07-11 NOTE — ED Notes (Addendum)
Brother called out from lobby for assistance reporting pt has passed out; pt sitting in w/c, alert but unable to sit upright, pale in color; pt taken immed to room 10; assisted into hosp gown, onto stretcher and card monitor placed; charge nurse and care nurse Diamantina Monks RN notif; acuity level changed

## 2022-07-11 NOTE — Consult Note (Signed)
GI Inpatient Consult Note  Reason for Consult: GI bleeding   Attending Requesting Consult:Dr. Ivor Costa  History of Present Illness: Fred Perkins is a 77 y.o. male seen for evaluation of GIB at the request of Dr. Blaine Hamper. Patient has a PMH of CAD with stent to RCA (2002), HLD, BPH, GERD, arthritis, skin cancer, diverticulosis, colon polyps.  Patient presented to the [facility] ED last night for chief complaint of mild mid abdominal discomfort, black stool with onset last evening of hematochezia and syncope. Upon presentation to the ED, vital signs were 124/82, 84, 16, 98.5, SpO2 98%. Labs were significant for Hgb 9.7 down from 15 three  weeks ago and elevated BUN 54 up from baseline 14. Imaging studies CTA:   no active GI bleeding and colon diverticulosis.   Fred Perkins reports that he has severe arthritis that was impacting his ability to work. He took a prednisone taper in March. He has been taking long-standing NSAIDs  and was taken off of his diclofenac a week ago because it was upsetting his stomach-mild diffuse discomfort. He was placed on pantoprazole 40 mg daily last week.  He was started on ibuprofen 800 mg twice daily last Friday in place of the diclofenac.  He has noticed mild stomach discomfort and black tarry looking stools for one week or so. Over the last few days,  he has taken a few Tums for mild upper stomach discomfort.   He has had no nausea, vomiting, or hematemesis. Reports 7 lb  weight loss.   Yesterday, he held his morning ibuprofen as it upset his stomach. He worked all day. Last evening, his arthritis bothered him and he took a dose of ibuprofen 800 mg.  Shortly after that, he started to see red blood mixed in with his black stool.  He had 2 loose bowel movements, gurgling in stomach, and mild upper discomfort. He had syncopal episode x 2 last night at 10 PM when he stood up.  His brother brought him to the hospital for evaluation.   He has had no further GI bleeding since he  has been in the ED, but he has gurgling and an urge to have a bowel movement now.  He denies any abdominal pain or discomfort at this time.  VSS. He is typed and screened. CBC is being drawn now.He was started on ceftriaxone 1 g IV for antibiotic prophylaxis along with pantoprazole 80 mg IV bolus followed by 8 mg/h IV infusion.    Last Colonoscopy: 05/21/2021: Screening colonoscopy: Indication: Nonbleeding internal hemorrhoids, diverticulosis in the sigmoid colon exam otherwise normal. Last Endoscopy: none   Past Medical History:  Past Medical History:  Diagnosis Date   Arthritis    BPH (benign prostatic hyperplasia)    H/O heart artery stent    Heart disease    High cholesterol    Skin cancer, basal cell    Squamous cell skin cancer     Problem List: Patient Active Problem List   Diagnosis Date Noted   GI bleeding 07/11/2022   BPH (benign prostatic hyperplasia) 07/11/2022   Arthritis 07/11/2022   Acute blood loss anemia 07/11/2022   Syncope 07/11/2022   HLD (hyperlipidemia) 07/11/2022   CAD (coronary artery disease) 07/11/2022    Past Surgical History: Past Surgical History:  Procedure Laterality Date   BACK SURGERY  1992   L4, 5   CATARACT EXTRACTION, BILATERAL  2014   MOHS SURGERY      Allergies: Allergies  Allergen Reactions  Diclofenac Nausea And Vomiting   Meloxicam Nausea And Vomiting   Sudafed [Pseudoephedrine] Other (See Comments)    Home Medications: (Not in a hospital admission)  Home medication reconciliation was completed with the patient.   Scheduled Inpatient Medications:    Continuous Inpatient Infusions:    sodium chloride 100 mL/hr at 07/11/22 0831   erythromycin     pantoprazole 8 mg/hr (07/11/22 0456)    PRN Inpatient Medications:  acetaminophen, ondansetron (ZOFRAN) IV, oxyCODONE  Family History: family history includes Alzheimer's disease in his mother; Dementia in his father.  The patient's family history is negative for  inflammatory bowel disorders, GI malignancy, or solid organ transplantation.  Social History:   reports that he has never smoked. He has never used smokeless tobacco. The patient denies ETOH, tobacco, or drug use.   Review of Systems: Constitutional: Weight is stable.  Eyes: No changes in vision. ENT: No oral lesions, sore throat.  GI: see HPI.  Heme/Lymph: No easy bruising.  CV: No chest pain.  GU: No hematuria.  Integumentary: No rashes.  Neuro: No headaches.  Psych: No depression/anxiety.  Endocrine: No heat/cold intolerance.  Allergic/Immunologic: No urticaria.  Resp: No cough, SOB.  Musculoskeletal: No joint swelling.    Physical Examination: BP 136/66 (BP Location: Right Arm)   Pulse 66   Temp 98.5 F (36.9 C) (Oral)   Resp 18   Ht 5\' 9"  (1.753 m)   Wt 72.6 kg   SpO2 99%   BMI 23.63 kg/m  Gen: NAD, alert and oriented x 4, color is good HEENT: EOMI, Neck: supple, no JVD or thyromegaly Chest: CTA bilaterally, no wheezes, crackles, or other adventitious sounds CV: RRR, no m/g/c/r Abd: soft, NT in all quads, ND, +BS in all four quadrants; no HSM, guarding, rigidity, or rebound tenderness Ext: no edema, well perfused with 2+ pulses, Skin: no rash or lesions noted Lymph: no LAD  Data: Lab Results  Component Value Date   WBC 10.2 07/11/2022   HGB 9.7 (L) 07/11/2022   HCT 30.8 (L) 07/11/2022   MCV 100.7 (H) 07/11/2022   PLT 278 07/11/2022   Recent Labs  Lab 07/11/22 0005  HGB 9.7*   Lab Results  Component Value Date   NA 135 07/11/2022   K 4.7 07/11/2022   CL 108 07/11/2022   CO2 20 (L) 07/11/2022   BUN 54 (H) 07/11/2022   CREATININE 1.15 07/11/2022   Lab Results  Component Value Date   ALT 14 07/11/2022   AST 16 07/11/2022   ALKPHOS 40 07/11/2022   BILITOT 0.9 07/11/2022   CLINICAL DATA:  Abdominal pain.  Rectal bleed.   EXAM: CTA ABDOMEN AND PELVIS WITHOUT AND WITH CONTRAST   TECHNIQUE: Multidetector CT imaging of the abdomen and pelvis  was performed using the standard protocol during bolus administration of intravenous contrast. Multiplanar reconstructed images and MIPs were obtained and reviewed to evaluate the vascular anatomy.   RADIATION DOSE REDUCTION: This exam was performed according to the departmental dose-optimization program which includes automated exposure control, adjustment of the mA and/or kV according to patient size and/or use of iterative reconstruction technique.   CONTRAST:  134mL OMNIPAQUE IOHEXOL 350 MG/ML SOLN   COMPARISON:  CT abdomen pelvis dated 01/30/2018.   FINDINGS: VASCULAR   Aorta: Moderate atherosclerotic calcification of the abdominal aorta. No aneurysmal dilatation or dissection. No periaortic fluid collection.   Celiac: Atherosclerotic calcification of the origin of the celiac artery. The celiac axis and its main branches are patent.  SMA: The SMA is patent.   Renals: The renal arteries are patent.   IMA: The IMA is patent.   Inflow: Mild atherosclerotic calcification of the iliac arteries. The iliac arteries are patent.   Proximal Outflow: The visualized proximal outflow appears patent.   Veins: The IVC is unremarkable. No portal venous gas. The SMV, splenic vein, and main portal vein are patent.   Review of the MIP images confirms the above findings.   NON-VASCULAR   Lower chest: Minimal bibasilar atelectasis. The visualized lung bases are otherwise clear. There is coronary vascular calcification.   No intra-abdominal free air or free fluid.   Hepatobiliary: Small liver cysts. No biliary dilatation. The gallbladder is unremarkable.   Pancreas: Unremarkable. No pancreatic ductal dilatation or surrounding inflammatory changes.   Spleen: Normal in size without focal abnormality.   Adrenals/Urinary Tract: The adrenal glands are unremarkable the kidneys, visualized ureters, and urinary bladder appear unremarkable.   Stomach/Bowel: There is moderate sigmoid  diverticulosis without active inflammatory changes. There is no bowel obstruction or active inflammation. No CT evidence of active GI bleed. The appendix is normal.   Lymphatic: No adenopathy.   Reproductive: Enlarged prostate gland measuring 5.5 cm in transverse axial diameter. The seminal vesicles are symmetric.   Other: None   Musculoskeletal: Osteopenia with degenerative changes of the spine and hips. No acute osseous pathology.   IMPRESSION: 1. No acute intra-abdominal or pelvic pathology. No CT evidence of active GI bleed. 2. Moderate sigmoid diverticulosis. No bowel obstruction. Normal appendix. 3. Enlarged prostate gland.     Electronically Signed   By: Anner Crete M.D.   On: 07/11/2022 03:00  Assessment/Plan: Mr. Pousson is a 77 y.o. male is a 77 y.o. male seen for evaluation of GIB at the request of Dr. Blaine Hamper. Patient has a PMH of CAD with stent, HLD, BPH, GERD, arthritis, skin cancer, diverticulosis, colon polyps.  Patient presented to the [facility] ED last night for chief complaint of mild mid abdominal discomfort, black stool with onset last evening of hematochezia and syncope.He became syncopal yesterday with onset of hematochezia.  Differential diagnosis is likely upper GI bleed secondary to Stricker duodenal ulcer, AV malformation malformation, neoplasm. He has reported 7 lb weight loss.    #GI bleed:  -Patient presented with mild upper abdominal discomfort and melanotic stools mixed with red stool.  Acute drop in hemoglobin from 15  to 9.7 to 8.3  along with markedly elevated BUN 54  and normal creatine 1.15. Etiology is likely NSAID induced upper GI bleed causing melena. - VSS and oxygenating well.  - He is not on anti coagulation. - Normal PT/INR. - Pending iron studies and B12.  - CTA showed no active bleeding  # Acute blood loss anemia  Secondary to GI bleed  -Monitor H&H.  Transfusion resuscitation as per primary team. Currently no indication for blood  transfusion.  -Avoid frequent lab draws to prevent lab induced anemia -Supportive care and antiemetics per primary team -Maintain 2 sites IV access -Avoid NSAIDs -Monitor for GI bleed -EGD today with possible biopsy, control bleeding, polypectomy and interventions as necessary.   -NPO.   # Diverticulosis:  -  CTA shows  diverticulosis but no abdominal pain or tenderness to support diagnosis at this time. - No leukocytosis- WBC 10.2  - Recent csy last year is reassuring : IH- non bleeding and sigmoid diverticulosis.   # Hx CAD:  - No CP or SOB -Cardiogenic syncope is possible but more likely had vagal  episode in setting of volume depletion depletion and  GI bleeding. - EKG: no evidence of ischemia or elevated troponin  -Cardiac monitor to evaluate for any arrhythmia.   Thank you for the consult. Please call with questions or concerns.  Denice Paradise, [facility] [phone]

## 2022-07-11 NOTE — Anesthesia Postprocedure Evaluation (Signed)
Anesthesia Post Note  Patient: Fred Perkins  Procedure(s) Performed: ESOPHAGOGASTRODUODENOSCOPY (EGD) WITH PROPOFOL  Patient location during evaluation: Endoscopy Anesthesia Type: General Level of consciousness: awake and alert Pain management: pain level controlled Vital Signs Assessment: post-procedure vital signs reviewed and stable Respiratory status: spontaneous breathing, nonlabored ventilation, respiratory function stable and patient connected to nasal cannula oxygen Cardiovascular status: blood pressure returned to baseline and stable Postop Assessment: no apparent nausea or vomiting Anesthetic complications: no   No notable events documented.   Last Vitals:  Vitals:   07/11/22 1219 07/11/22 1229  BP: (!) 93/56 128/67  Pulse:  68  Resp: 18 15  Temp:    SpO2:  100%    Last Pain:  Vitals:   07/11/22 1229  TempSrc:   PainSc: 0-No pain                 Precious Haws [place]

## 2022-07-11 NOTE — Care Plan (Signed)
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  Continue twice a day IV Protonix 40 mg for the next 24 to 48 hours.  Then transition to twice daily p.o. omeprazole 40 mg Complete nsaid avoidance Clear liquid diet today  Monitor H&H.  Transfusion and resuscitation as per primary team Avoid frequent lab draws to prevent lab induced anemia Supportive care and antiemetics as per primary team Maintain two sites IV access Avoid nsaids Monitor for GIB. Hold dvt ppx If rebleed occurs consider repeat upper endoscopy versus GDA embolization with vascular surgery Discussed with patient and hospitalist team  Ronne Binning, [facility]

## 2022-07-11 NOTE — ED Provider Notes (Signed)
[facility] Provider Note    Event Date/Time   First MD Initiated Contact with Patient 07/11/22 [phone]     (approximate)   History   Abdominal Pain   HPI  Fred Perkins is a 77 y.o. male whose medical history is most notable for prior coronary artery disease status post 1 cardiac stent.  He denies being on any blood thinners including aspirin and Plavix.  He presents tonight for several days of GI bleeding that seems to be getting worse and then tonight he had a syncopal episode after going to the bathroom.  He reports that he started noticing 3 to 4 days ago that he was having some dark-colored stool.  At the same time he was having some intermittent dull aching abdominal pain and "grumbling" in his abdomen.  No nausea or vomiting but he had a substantial loss of appetite.  The amount of dark stool increased even though he did not have increased frequency of bowel movements.  However within the last 24 hours he started to notice bright red blood in his stool as well.  Tonight he went to the bathroom during the night and after he got up to go back to bed he got very hot and sweaty and then passed out just as he got back to his bed.  He did not sustain any injuries when he passed out.  He is not having any abdominal pain at this time.  He has not had a recent fever.  Of note, the patient has been on multiple NSAIDs recently.  He was started on diclofenac a few weeks ago.  He was then switched to ibuprofen and within the last week his primary care doctor started him on pantoprazole but he was not taking it previously.     Physical Exam   Triage Vital Signs: ED Triage Vitals  Enc Vitals Group     BP 07/11/22 0000 124/82     Pulse Rate 07/11/22 0000 84     Resp 07/11/22 0000 16     Temp 07/11/22 0000 98.5 F (36.9 C)     Temp Source 07/11/22 0000 Oral     SpO2 07/11/22 0000 98 %     Weight 07/10/22 2342 72.6 kg (160 lb)     Height 07/10/22 2342 1.753 m (5'  9")     Head Circumference --      Peak Flow --      Pain Score 07/10/22 2342 5     Pain Loc --      Pain Edu? --      Excl. in [place]? --     Most recent vital signs: Vitals:   07/11/22 0000 07/11/22 0025  BP: 124/82 136/66  Pulse: 84 66  Resp: 16 18  Temp: 98.5 F (36.9 C)   SpO2: 98% 99%     General: Awake, no distress.  Does not appear toxic but appears ill, or at least that he feels ill. CV:  Good peripheral perfusion.  Regular rate and rhythm. Resp:  Normal effort. Speaking easily and comfortably, no accessory muscle usage nor intercostal retractions.  Lungs are clear to auscultation. Abd:  No distention.  No tenderness to palpation throughout the abdomen.  No pulsatile abdominal masses. Fred:  Rectal exam is notable for melena.  No substantial pain on exam, no abnormal external findings.   ED Results / Procedures / Treatments   Labs (all labs ordered are listed, but only abnormal results are displayed)  Labs Reviewed  CBC WITH DIFFERENTIAL/PLATELET - Abnormal; Notable for the following components:      Result Value   RBC 3.06 (*)    Hemoglobin 9.7 (*)    HCT 30.8 (*)    MCV 100.7 (*)    Abs Immature Granulocytes 0.09 (*)    All Fred components within normal limits  COMPREHENSIVE METABOLIC PANEL - Abnormal; Notable for the following components:   CO2 20 (*)    Glucose, Bld 126 (*)    BUN 54 (*)    Calcium 8.3 (*)    Total Protein 5.7 (*)    Albumin 3.0 (*)    All Fred components within normal limits  URINALYSIS, ROUTINE W REFLEX MICROSCOPIC - Abnormal; Notable for the following components:   Color, Urine STRAW (*)    APPearance CLEAR (*)    Specific Gravity, Urine 1.031 (*)    Hgb urine dipstick SMALL (*)    All Fred components within normal limits  LIPASE, BLOOD  CBC  CBC  CBC  PROTIME-INR  APTT  VITAMIN B12  FOLATE  IRON AND TIBC  FERRITIN  RETICULOCYTES  TYPE AND SCREEN  TROPONIN I (HIGH SENSITIVITY)     EKG  ED ECG REPORT I, Hinda Kehr, the attending physician, personally viewed and interpreted this ECG.  Date: 07/11/2022 EKG Time: 00: 04 Rate: 92 Rhythm: normal sinus rhythm QRS Axis: normal Intervals: Right bundle branch block ST/T Wave abnormalities: Non-specific ST segment / T-wave changes, but no clear evidence of acute ischemia. Narrative Interpretation: no definitive evidence of acute ischemia; does not meet STEMI criteria.    RADIOLOGY See hospital course for details, but patient's CT angio GI bleed does not show any sign of active extravasation.    PROCEDURES:  Critical Care performed: No  .1-3 Lead EKG Interpretation  Performed by: Hinda Kehr, MD Authorized by: Hinda Kehr, MD     Interpretation: normal     ECG rate:  68   ECG rate assessment: normal     Rhythm: sinus rhythm     Ectopy: none     Conduction: normal      MEDICATIONS ORDERED IN ED: Medications  pantoprozole (PROTONIX) 80 mg /NS 100 mL infusion (8 mg/hr Intravenous New Bag/Given 07/11/22 0456)  sodium chloride 0.9 % bolus 500 mL (has no administration in time range)  0.9 %  sodium chloride infusion (has no administration in time range)  ondansetron (ZOFRAN) injection 4 mg (has no administration in time range)  acetaminophen (TYLENOL) tablet 650 mg (has no administration in time range)  oxyCODONE (Oxy IR/ROXICODONE) immediate release tablet 5 mg (has no administration in time range)  iohexol (OMNIPAQUE) 350 MG/ML injection 100 mL (100 mLs Intravenous Contrast Given 07/11/22 0212)  sodium chloride 0.9 % bolus 500 mL (0 mLs Intravenous Stopped 07/11/22 0417)  cefTRIAXone (ROCEPHIN) 1 g in sodium chloride 0.9 % 100 mL IVPB (0 g Intravenous Stopped 07/11/22 0440)  pantoprazole (PROTONIX) 80 mg /NS 100 mL IVPB (0 mg Intravenous Stopped 07/11/22 0435)     IMPRESSION / MDM / ASSESSMENT AND PLAN / ED COURSE  I reviewed the triage vital signs and the nursing notes.                              Differential diagnosis includes,  but is not limited to, upper GI bleed possibly due to NSAID use, ulcer, diverticular bleed, AV malformation, neoplasm.  Cardiogenic syncope is possible but more  likely the patient had a vagal episode in the setting of volume depletion and GI bleeding.  Patient's presentation is most consistent with acute presentation with potential threat to life or bodily function.  Labs/studies ordered: CBC with differential, CMP, urinalysis, lipase, high-sensitivity troponin, type and screen, CTA GI bleed protocol Interventions/Medications given: Pantoprazole 80 mg followed by 8 mg/h infusion, ceftriaxone 1 g IV, normal saline 500 L [facility] Course my include additional interventions or labs/studies not listed above.)  Vital signs are stable and within normal limits.  I suspect the patient may have an NSAID related upper GI bleed given the melena and the recent use of multiple different NSAIDs.  Given his symptoms and the abdominal discomfort as well, I am ordering a CTA GI bleed protocol.  His labs are generally reassuring.  His hemoglobin is low at 9.7 but I have no labs against which to compare.  However I suspect his hemoglobin will continue to drop as he continues to bleed and this is relatively acute.  Metabolic panel and urinalysis are generally reassuring.  No abnormality on high-sensitivity troponin his EKG shows no sign of ischemia.  No indication for emergent blood transfusion at this time.  I will give a small fluid bolus to support probable volume loss and to support his preload, but I also do not want to dilute his hematocrit even further.  Patient understands that he needs hospitalization given the acute bleeding and resulting syncope.  Once I assess his CTA, I will consult the hospitalist for admission.  He agrees with the plan.  The patient is on the cardiac monitor to evaluate for evidence of arrhythmia and/or significant heart rate changes.   Clinical Course as of 07/11/22 0814  Thu Jul 11, 2022  0307 CT ANGIO GI BLEED I viewed and interpreted the patient's CTA GI bleed protocol.  No obvious extravasation at this time.  Radiology report agrees.  Given the probability of an NSAID induced upper GI bleed causing melena, I ordered ceftriaxone 1 g IV for antibiotic prophylaxis along with pantoprazole 80 mg IV bolus followed by an 8 mg/h IV infusion.  I consulted the hospitalist for admission. [CF]  [id number] Consulted with Dr. Sidney Ace with the hospitalist service.  He will admit [CF]    Clinical Course User Index [CF] Hinda Kehr, MD     FINAL CLINICAL IMPRESSION(S) / ED DIAGNOSES   Final diagnoses:  Acute GI bleeding  Melena  Syncope and collapse  Anemia, unspecified type     Rx / DC Orders   ED Discharge Orders     None        Note:  This document was prepared using Dragon voice recognition software and may include unintentional dictation errors.   Hinda Kehr, MD 07/11/22 [phone]

## 2022-07-11 NOTE — Transfer of Care (Signed)
Immediate Anesthesia Transfer of Care Note  Patient: Fred Perkins  Procedure(s) Performed: ESOPHAGOGASTRODUODENOSCOPY (EGD) WITH PROPOFOL  Patient Location: Endoscopy Unit  Anesthesia Type:General  Level of Consciousness: drowsy and patient cooperative  Airway & Oxygen Therapy: Patient Spontanous Breathing and Patient connected to face mask oxygen  Post-op Assessment: Report given to RN and Post -op Vital signs reviewed and stable  Post vital signs: Reviewed and stable  Last Vitals:  Vitals Value Taken Time  BP 113/64 07/11/22 1159  Temp 35.8 C 07/11/22 1159  Pulse 60 07/11/22 1202  Resp 20 07/11/22 1202  SpO2 100 % 07/11/22 1202  Vitals shown include unvalidated device data.  Last Pain:  Vitals:   07/11/22 1159  TempSrc: Temporal  PainSc: Asleep         Complications: No notable events documented.

## 2022-07-11 NOTE — Anesthesia Preprocedure Evaluation (Signed)
Anesthesia Evaluation  Patient identified by MRN, date of birth, ID band Patient awake    Reviewed: Allergy & Precautions, NPO status , Patient's Chart, lab work & pertinent test results  History of Anesthesia Complications (+) history of anesthetic complications (vagal with IV placement)  Airway Mallampati: III  TM Distance: <3 FB Neck ROM: full    Dental  (+) Chipped, Poor Dentition, Missing   Pulmonary neg pulmonary ROS, neg shortness of breath   Pulmonary exam normal        Cardiovascular Exercise Tolerance: Good (-) angina + CAD and + Cardiac Stents  Normal cardiovascular exam     Neuro/Psych negative neurological ROS  negative psych ROS   GI/Hepatic negative GI ROS, Neg liver ROS,neg GERD  ,,  Endo/Other  negative endocrine ROS    Renal/GU negative Renal ROS  negative genitourinary   Musculoskeletal   Abdominal   Peds  Hematology  (+) Blood dyscrasia, anemia   Anesthesia Other Findings Patient is NPO appropriate and reports no nausea or vomiting today.   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  Past Surgical History: 1992: BACK SURGERY     Comment:  L4, 5 2014: CATARACT EXTRACTION, BILATERAL No date: MOHS SURGERY  BMI    Body Mass Index: 23.63 kg/m      Reproductive/Obstetrics negative OB ROS                             Anesthesia Physical Anesthesia Plan  ASA: 3  Anesthesia Plan: General   Post-op Pain Management:    Induction: Intravenous  PONV Risk Score and Plan: Propofol infusion and TIVA  Airway Management Planned: Natural Airway and Nasal Cannula  Additional Equipment:   Intra-op Plan:   Post-operative Plan:   Informed Consent: I have reviewed the  patients History and Physical, chart, labs and discussed the procedure including the risks, benefits and alternatives for the proposed anesthesia with the patient or authorized representative who has indicated his/her understanding and acceptance.     Dental Advisory Given  Plan Discussed with: Anesthesiologist, CRNA and Surgeon  Anesthesia Plan Comments: (Patient consented for risks of anesthesia including but not limited to:  - adverse reactions to medications - risk of airway placement if required - damage to eyes, teeth, lips or other oral mucosa - nerve damage due to positioning  - sore throat or hoarseness - Damage to heart, brain, nerves, lungs, other parts of body or loss of life  Patient voiced understanding.)       Anesthesia Quick Evaluation

## 2022-07-11 NOTE — Op Note (Signed)
[facility] Patient Name: Fred Perkins Procedure Date: 07/11/2022 11:37 AM MRN: [id number] Account #: [id number] Date of Birth: 1945-08-27 Admit Type: Inpatient Age: 77 Room: [facility] ENDO ROOM 1 Gender: Male Note Status: Finalized Instrument Name: Upper Endoscope [id number] Procedure:             Upper GI endoscopy Indications:           Melena Providers:             Rueben Bash, DO Referring MD:          Rusty Aus, MD (Referring MD) Medicines:             Monitored Anesthesia Care Complications:         No immediate complications. Estimated blood loss: None. Procedure:             Pre-Anesthesia Assessment:                        - Prior to the procedure, a History and Physical was                         performed, and patient medications and allergies were                         reviewed. The patient is competent. The risks and                         benefits of the procedure and the sedation options and                         risks were discussed with the patient. All questions                         were answered and informed consent was obtained.                         Patient identification and proposed procedure were                         verified by the physician, the nurse, the anesthetist                         and the technician in the endoscopy suite. Mental                         Status Examination: alert and oriented. Airway                         Examination: normal oropharyngeal airway and neck                         mobility. Respiratory Examination: clear to                         auscultation. CV Examination: RRR, no murmurs, no S3                         or S4. Prophylactic Antibiotics: The patient does not  require prophylactic antibiotics. Prior                         Anticoagulants: The patient has taken no anticoagulant                         or antiplatelet agents. ASA Grade  Assessment: III - A                         patient with severe systemic disease. After reviewing                         the risks and benefits, the patient was deemed in                         satisfactory condition to undergo the procedure. The                         anesthesia plan was to use monitored anesthesia care                         (MAC). Immediately prior to administration of                         medications, the patient was re-assessed for adequacy                         to receive sedatives. The heart rate, respiratory                         rate, oxygen saturations, blood pressure, adequacy of                         pulmonary ventilation, and response to care were                         monitored throughout the procedure. The physical                         status of the patient was re-assessed after the                         procedure.                        After obtaining informed consent, the endoscope was                         passed under direct vision. Throughout the procedure,                         the patient's blood pressure, pulse, and oxygen                         saturations were monitored continuously. The Endoscope                         was introduced through the mouth, and advanced to the  second part of duodenum. The upper GI endoscopy was                         accomplished without difficulty. The patient tolerated                         the procedure well. Findings:      The Z-line was regular. Estimated blood loss: none.      Esophagogastric landmarks were identified: the gastroesophageal junction       was found at 35 cm from the incisors.      One benign-appearing, intrinsic mild (non-circumferential scarring)       stenosis was found 35 cm from the incisors. This stenosis measured 1.2       cm (inner diameter) x less than one cm (in length). The stenosis was       traversed. Estimated blood loss: none.       Localized mild inflammation characterized by erythema was found in the       gastric antrum. Estimated blood loss: none.      A small hiatal hernia was present. Estimated blood loss: none.      Four non-bleeding cratered duodenal ulcers with a flat pigmented spot       (Forrest Class IIc) were found in the duodenal bulb and in the first       portion of the duodenum. The largest lesion was 4 mm in largest       dimension. Ulcers in combination with clean base and flat pigmented       spot. One appeared along the posterior bulb which could not be       completely visualized. No active bleeding, fresh blood or old blood       within the lumen.      The exam of the duodenum was otherwise normal. Impression:            - Z-line regular.                        - Esophagogastric landmarks identified.                        - Benign-appearing esophageal stenosis.                        - Gastritis.                        - Small hiatal hernia.                        - Non-bleeding duodenal ulcers with a flat pigmented                         spot (Forrest Class IIc).                        - No specimens collected. Recommendation:        - Return patient to hospital ward for ongoing care.                        - Clear liquid diet.                        -  Continue present medications.                        - Protonix 40 mg 12 hours iv then transition to 40 mg                         twice daily po for 8 weeks total therapy.                        - No ibuprofen, naproxen, or other non-steroidal                         anti-inflammatory drugs indefinitely.                        - Repeat upper endoscopy PRN for retreatment.                        - Return to referring physician as previously                         scheduled.                        - The findings and recommendations were discussed with                         the patient.                        - The findings and  recommendations were discussed with                         the referring physician. Procedure Code(s):     --- Professional ---                        [phone], Esophagogastroduodenoscopy, flexible,                         transoral; diagnostic, including collection of                         specimen(s) by brushing or washing, when performed                         (separate procedure) Diagnosis Code(s):     --- Professional ---                        K22.2, Esophageal obstruction                        K29.70, Gastritis, unspecified, without bleeding                        K44.9, Diaphragmatic hernia without obstruction or                         gangrene                        K26.9, Duodenal ulcer, unspecified as acute or  chronic, without hemorrhage or perforation                        K92.1, Melena (includes Hematochezia) CPT copyright 2022 [employer]. All rights reserved. The codes documented in this report are preliminary and upon coder review may  be revised to meet current compliance requirements. Attending Participation:      I personally performed the entire procedure. Volney American, DO Annamaria Helling DO, DO 07/11/2022 12:03:24 PM This report has been signed electronically. Number of Addenda: 0 Note Initiated On: 07/11/2022 11:37 AM Estimated Blood Loss:  Estimated blood loss: none.      [facility]

## 2022-07-11 NOTE — H&P (Signed)
History and Physical    Fred Perkins [id number] DOB: April 12, 1945 DOA: 07/11/2022  Referring MD/NP/PA:   PCP: Rusty Aus, MD   Patient coming from:  The patient is coming from home.    Chief Complaint: GI  bleeding  HPI: Fred Perkins is a 77 y.o. male with medical history significant of CAD with stent placement, HLD, BPH, GERD, arthtitis, skin cancer, who presents with GI bleeding.   Pt states that he has dark-colored stool for 3-4 days. Since yesterday, started having bright red blood in his stool. Patient denies abdominal pain, but states that he has some abdominal " grumbling".  No nausea and vomiting.  No fever or chills.  Patient does not have chest pain, cough, short of breath.  Of note, pt is taking NSAIDs, diclofenac and ibuprofen, also used the prednisone recently.  Patient states that he developed lightheadedness and passed out last night for few seconds. No injury. No unilateral numbness or tinglings in extremities.  No facial droop or slurred speech.  Denies symptoms of UTI.  Data reviewed independently and ED Course: pt was found to have  Hgb 15.3 on 06/17/22 --> 9.7 --> 8.4, WBC 10.2, GFR> 60, temperature normal, blood pressure 136/66, heart rate 84, RR 18, oxygen saturation 99% on room air.  CTA per GI bleeding protocol is negative for active bleeding, but showed moderate sigmoid diverticulosis.  Patient is admitted to PCU as inpatient.  Dr. Virgina Jock of GI is consulted.  EKG: I have personally reviewed.  Sinus rhythm, bifascicular block, early R wave progression, QTc 466   Review of Systems:   General: no fevers, chills, no body weight gain, fatigue HEENT: no blurry vision, hearing changes or sore throat Respiratory: no dyspnea, coughing, wheezing CV: no chest pain, no palpitations GI: no nausea, vomiting, abdominal pain, diarrhea, constipation. Has rectal bleeding GU: no dysuria, burning on urination, increased urinary frequency, hematuria  Ext: no leg  edema Neuro: no unilateral weakness, numbness, or tingling, no vision change or hearing loss Skin: no rash, no skin tear. MSK: No muscle spasm, no deformity, no limitation of range of movement in spin Heme: No easy bruising.  Travel history: No recent long distant travel.   Allergy:  Allergies  Allergen Reactions   Diclofenac Nausea And Vomiting   Meloxicam Nausea And Vomiting   Sudafed [Pseudoephedrine] Other (See Comments)    Past Medical History:  Diagnosis Date   Arthritis    BPH (benign prostatic hyperplasia)    H/O heart artery stent    Heart disease    High cholesterol    Skin cancer, basal cell    Squamous cell skin cancer     Past Surgical History:  Procedure Laterality Date   BACK SURGERY  1992   L4, 5   CATARACT EXTRACTION, BILATERAL  2014   macular surgery Right    MOHS SURGERY      Social History:  reports that he has never smoked. He has never used smokeless tobacco. He reports that he does not currently use alcohol. He reports that he does not currently use drugs.  Family History:  Family History  Problem Relation Age of Onset   Dementia Father    Alzheimer's disease Mother      Prior to Admission medications   Medication Sig Start Date End Date Taking? Authorizing Provider  pantoprazole (PROTONIX) 40 MG tablet Take 40 mg by mouth daily. 07/03/22 07/03/23 Yes [provider]  ibuprofen (ADVIL) 800 MG tablet Take 800 mg  by mouth 2 (two) times daily with a meal. 07/03/22 07/03/23  [provider]  predniSONE (DELTASONE) 20 MG tablet Take 20 mg by mouth daily for 10 days. Than take 10 mg daily for 10 days. 06/17/22   [provider]  simvastatin (ZOCOR) 40 MG tablet Take 40 mg by mouth daily. 12/11/17   [provider]  tamsulosin (FLOMAX) 0.4 MG CAPS capsule Take 0.4 mg by mouth daily. 09/15/17   [provider]    Physical Exam: Vitals:   07/10/22 2342 07/11/22 0000 07/11/22 0025 07/11/22 1103  BP:  124/82  136/66 124/77  Pulse:  84 66 73  Resp:  16 18 18   Temp:  98.5 F (36.9 C)  (!) 96.5 F (35.8 C)  TempSrc:  Oral  Temporal  SpO2:  98% 99% 100%  Weight: 72.6 kg     Height: 5\' 9"  (1.753 m)      General: Not in acute distress HEENT:       Eyes: PERRL, EOMI, no scleral icterus.       ENT: No discharge from the ears and nose, no pharynx injection, no tonsillar enlargement.        Neck: No JVD, no bruit, no mass felt. Heme: No neck lymph node enlargement. Cardiac: S1/S2, RRR, No murmurs, No gallops or rubs. Respiratory: No rales, wheezing, rhonchi or rubs. GI: Soft, nondistended, nontender, no rebound pain, no organomegaly, BS present. GU: No hematuria Ext: No pitting leg edema bilaterally. 1+DP/PT pulse bilaterally. Musculoskeletal: No joint deformities, No joint redness or warmth, no limitation of ROM in spin. Skin: No rashes.  Neuro: Alert, oriented X3, cranial nerves II-XII grossly intact, moves all extremities normally.  Psych: Patient is not psychotic, no suicidal or hemocidal ideation.  Labs on Admission: I have personally reviewed following labs and imaging studies  CBC: Recent Labs  Lab 07/11/22 0005 07/11/22 0824  WBC 10.2 8.7  NEUTROABS 7.6  --   HGB 9.7* 8.4*  HCT 30.8* 26.1*  MCV 100.7* 98.9  PLT 278 [id number]   Basic Metabolic Panel: Recent Labs  Lab 07/11/22 0005  NA 135  K 4.7  CL 108  CO2 20*  GLUCOSE 126*  BUN 54*  CREATININE 1.15  CALCIUM 8.3*   GFR: Estimated Creatinine Clearance: 54.6 mL/min (by C-G formula based on SCr of 1.15 mg/dL). Liver Function Tests: Recent Labs  Lab 07/11/22 0005  AST 16  ALT 14  ALKPHOS 40  BILITOT 0.9  PROT 5.7*  ALBUMIN 3.0*   Recent Labs  Lab 07/11/22 0005  LIPASE 37   No results for input(s): "AMMONIA" in the last 168 hours. Coagulation Profile: Recent Labs  Lab 07/11/22 0824  INR 1.2   Cardiac Enzymes: No results for input(s): "CKTOTAL", "CKMB", "CKMBINDEX", "TROPONINI" in the last 168 hours. BNP  (last 3 results) No results for input(s): "PROBNP" in the last 8760 hours. HbA1C: No results for input(s): "HGBA1C" in the last 72 hours. CBG: No results for input(s): "GLUCAP" in the last 168 hours. Lipid Profile: No results for input(s): "CHOL", "HDL", "LDLCALC", "TRIG", "CHOLHDL", "LDLDIRECT" in the last 72 hours. Thyroid Function Tests: No results for input(s): "TSH", "T4TOTAL", "FREET4", "T3FREE", "THYROIDAB" in the last 72 hours. Anemia Panel: Recent Labs    07/11/22 0005 07/11/22 0824  FOLATE  --  8.0  FERRITIN  --  138  TIBC  --  328  IRON  --  79  RETICCTPCT 2.1  --    Urine analysis:    Component Value  Date/Time   COLORURINE STRAW (A) 07/11/2022 0232   APPEARANCEUR CLEAR (A) 07/11/2022 0232   APPEARANCEUR Clear 10/20/2019 1517   LABSPEC 1.031 (H) 07/11/2022 0232   PHURINE 5.0 07/11/2022 0232   GLUCOSEU NEGATIVE 07/11/2022 0232   HGBUR SMALL (A) 07/11/2022 0232   BILIRUBINUR NEGATIVE 07/11/2022 0232   BILIRUBINUR Negative 10/20/2019 1517   KETONESUR NEGATIVE 07/11/2022 0232   PROTEINUR NEGATIVE 07/11/2022 0232   NITRITE NEGATIVE 07/11/2022 0232   LEUKOCYTESUR NEGATIVE 07/11/2022 0232   Sepsis Labs: @LABRCNTIP (procalcitonin:4,lacticidven:4) )No results found for this or any previous visit (from the past 240 hour(s)).   Radiological Exams on Admission: CT ANGIO GI BLEED  Result Date: 07/11/2022 CLINICAL DATA:  Abdominal pain.  Rectal bleed. EXAM: CTA ABDOMEN AND PELVIS WITHOUT AND WITH CONTRAST TECHNIQUE: Multidetector CT imaging of the abdomen and pelvis was performed using the standard protocol during bolus administration of intravenous contrast. Multiplanar reconstructed images and MIPs were obtained and reviewed to evaluate the vascular anatomy. RADIATION DOSE REDUCTION: This exam was performed according to the departmental dose-optimization program which includes automated exposure control, adjustment of the mA and/or kV according to patient size and/or use of  iterative reconstruction technique. CONTRAST:  181mL OMNIPAQUE IOHEXOL 350 MG/ML SOLN COMPARISON:  CT abdomen pelvis dated 01/30/2018. FINDINGS: VASCULAR Aorta: Moderate atherosclerotic calcification of the abdominal aorta. No aneurysmal dilatation or dissection. No periaortic fluid collection. Celiac: Atherosclerotic calcification of the origin of the celiac artery. The celiac axis and its main branches are patent. SMA: The SMA is patent. Renals: The renal arteries are patent. IMA: The IMA is patent. Inflow: Mild atherosclerotic calcification of the iliac arteries. The iliac arteries are patent. Proximal Outflow: The visualized proximal outflow appears patent. Veins: The IVC is unremarkable. No portal venous gas. The SMV, splenic vein, and main portal vein are patent. Review of the MIP images confirms the above findings. NON-VASCULAR Lower chest: Minimal bibasilar atelectasis. The visualized lung bases are otherwise clear. There is coronary vascular calcification. No intra-abdominal free air or free fluid. Hepatobiliary: Small liver cysts. No biliary dilatation. The gallbladder is unremarkable. Pancreas: Unremarkable. No pancreatic ductal dilatation or surrounding inflammatory changes. Spleen: Normal in size without focal abnormality. Adrenals/Urinary Tract: The adrenal glands are unremarkable the kidneys, visualized ureters, and urinary bladder appear unremarkable. Stomach/Bowel: There is moderate sigmoid diverticulosis without active inflammatory changes. There is no bowel obstruction or active inflammation. No CT evidence of active GI bleed. The appendix is normal. Lymphatic: No adenopathy. Reproductive: Enlarged prostate gland measuring 5.5 cm in transverse axial diameter. The seminal vesicles are symmetric. Other: None Musculoskeletal: Osteopenia with degenerative changes of the spine and hips. No acute osseous pathology. IMPRESSION: 1. No acute intra-abdominal or pelvic pathology. No CT evidence of active GI  bleed. 2. Moderate sigmoid diverticulosis. No bowel obstruction. Normal appendix. 3. Enlarged prostate gland. Electronically Signed   By: Anner Crete M.D.   On: 07/11/2022 03:00      Assessment/Plan Principal Problem:   GI bleeding Active Problems:   Acute blood loss anemia   Syncope   CAD (coronary artery disease)   HLD (hyperlipidemia)   BPH (benign prostatic hyperplasia)   Arthritis   Assessment and Plan:  GI bleeding and acute blood loss anemia: Hgb dropped from 15.3 to 9.7 --> 8.4.  Likely due to upper GI bleeding secondary to NSAID use.  Consulted Dr. Virgina Jock of GI.  - will admitted to PCU as inpatient - IVF: 500 mL x 2 of NS bolus, then at 100 mL/hr - Start  IV pantoprazole gtt - Check anemia panel - Zofran IV for nausea - Avoid NSAIDs and SQ heparin - Maintain IV access (2 large bore IVs if possible). - Monitor closely and follow q6h cbc, transfuse as necessary, if Hgb<7.0 - LaB: INR, PTT and type screen  Syncope: Most likely due to hypovolemic status. -IV fluid as above -Frequent neurocheck -Check orthostatic vital sign  CAD (coronary artery disease) -Zocor -No AS due to GIB  HLD (hyperlipidemia) -Zocor  BPH (benign prostatic hyperplasia) -flomax  Arthritis -prn tylenol       DVT ppx: SCD  Code Status: Full code  Family Communication: I have tried to call his wife, who did not pick up the phone.  I left a message  Disposition Plan:  Anticipate discharge back to previous environment  Consults called: dr. Virgina Jock of GI is consulted.  Admission status and Level of care: Progressive:    as inpt        Dispo: The patient is from: Home              Anticipated d/c is to: Home              Anticipated d/c date is: 2 days              Patient currently is not medically stable to d/c.    Severity of Illness:  The appropriate patient status for this patient is INPATIENT. Inpatient status is judged to be reasonable and necessary in order to  provide the required intensity of service to ensure the patient's safety. The patient's presenting symptoms, physical exam findings, and initial radiographic and laboratory data in the context of their chronic comorbidities is felt to place them at high risk for further clinical deterioration. Furthermore, it is not anticipated that the patient will be medically stable for discharge from the hospital within 2 midnights of admission.   * I certify that at the point of admission it is my clinical judgment that the patient will require inpatient hospital care spanning beyond 2 midnights from the point of admission due to high intensity of service, high risk for further deterioration and high frequency of surveillance required.*       Date of Service 07/11/2022    Ivor Costa [facility]   If 7PM-7AM, please contact night-coverage [email or web address] 07/11/2022, 11:21 AM

## 2022-07-11 NOTE — H&P (View-Only) (Signed)
GI Inpatient Consult Note  Reason for Consult: GI bleeding   Attending Requesting Consult:Dr. Ivor Costa  History of Present Illness: Fred Perkins is a 77 y.o. male seen for evaluation of GIB at the request of Dr. Blaine Hamper. Patient has a PMH of CAD with stent to RCA (2002), HLD, BPH, GERD, arthritis, skin cancer, diverticulosis, colon polyps.  Patient presented to the [facility] ED last night for chief complaint of mild mid abdominal discomfort, black stool with onset last evening of hematochezia and syncope. Upon presentation to the ED, vital signs were 124/82, 84, 16, 98.5, SpO2 98%. Labs were significant for Hgb 9.7 down from 15 three  weeks ago and elevated BUN 54 up from baseline 14. Imaging studies CTA:   no active GI bleeding and colon diverticulosis.   Mr. Fred Perkins reports that he has severe arthritis that was impacting his ability to work. He took a prednisone taper in March. He has been taking long-standing NSAIDs  and was taken off of his diclofenac a week ago because it was upsetting his stomach-mild diffuse discomfort. He was placed on pantoprazole 40 mg daily last week.  He was started on ibuprofen 800 mg twice daily last Friday in place of the diclofenac.  He has noticed mild stomach discomfort and black tarry looking stools for one week or so. Over the last few days,  he has taken a few Tums for mild upper stomach discomfort.   He has had no nausea, vomiting, or hematemesis. Reports 7 lb  weight loss.   Yesterday, he held his morning ibuprofen as it upset his stomach. He worked all day. Last evening, his arthritis bothered him and he took a dose of ibuprofen 800 mg.  Shortly after that, he started to see red blood mixed in with his black stool.  He had 2 loose bowel movements, gurgling in stomach, and mild upper discomfort. He had syncopal episode x 2 last night at 10 PM when he stood up.  His brother brought him to the hospital for evaluation.   He has had no further GI bleeding since he  has been in the ED, but he has gurgling and an urge to have a bowel movement now.  He denies any abdominal pain or discomfort at this time.  VSS. He is typed and screened. CBC is being drawn now.He was started on ceftriaxone 1 g IV for antibiotic prophylaxis along with pantoprazole 80 mg IV bolus followed by 8 mg/h IV infusion.    Last Colonoscopy: 05/21/2021: Screening colonoscopy: Indication: Nonbleeding internal hemorrhoids, diverticulosis in the sigmoid colon exam otherwise normal. Last Endoscopy: none   Past Medical History:  Past Medical History:  Diagnosis Date   Arthritis    BPH (benign prostatic hyperplasia)    H/O heart artery stent    Heart disease    High cholesterol    Skin cancer, basal cell    Squamous cell skin cancer     Problem List: Patient Active Problem List   Diagnosis Date Noted   GI bleeding 07/11/2022   BPH (benign prostatic hyperplasia) 07/11/2022   Arthritis 07/11/2022   Acute blood loss anemia 07/11/2022   Syncope 07/11/2022   HLD (hyperlipidemia) 07/11/2022   CAD (coronary artery disease) 07/11/2022    Past Surgical History: Past Surgical History:  Procedure Laterality Date   BACK SURGERY  1992   L4, 5   CATARACT EXTRACTION, BILATERAL  2014   MOHS SURGERY      Allergies: Allergies  Allergen Reactions  Diclofenac Nausea And Vomiting   Meloxicam Nausea And Vomiting   Sudafed [Pseudoephedrine] Other (See Comments)    Home Medications: (Not in a hospital admission)  Home medication reconciliation was completed with the patient.   Scheduled Inpatient Medications:    Continuous Inpatient Infusions:    sodium chloride 100 mL/hr at 07/11/22 0831   erythromycin     pantoprazole 8 mg/hr (07/11/22 0456)    PRN Inpatient Medications:  acetaminophen, ondansetron (ZOFRAN) IV, oxyCODONE  Family History: family history includes Alzheimer's disease in his mother; Dementia in his father.  The patient's family history is negative for  inflammatory bowel disorders, GI malignancy, or solid organ transplantation.  Social History:   reports that he has never smoked. He has never used smokeless tobacco. The patient denies ETOH, tobacco, or drug use.   Review of Systems: Constitutional: Weight is stable.  Eyes: No changes in vision. ENT: No oral lesions, sore throat.  GI: see HPI.  Heme/Lymph: No easy bruising.  CV: No chest pain.  GU: No hematuria.  Integumentary: No rashes.  Neuro: No headaches.  Psych: No depression/anxiety.  Endocrine: No heat/cold intolerance.  Allergic/Immunologic: No urticaria.  Resp: No cough, SOB.  Musculoskeletal: No joint swelling.    Physical Examination: BP 136/66 (BP Location: Right Arm)   Pulse 66   Temp 98.5 F (36.9 C) (Oral)   Resp 18   Ht 5\' 9"  (1.753 m)   Wt 72.6 kg   SpO2 99%   BMI 23.63 kg/m  Gen: NAD, alert and oriented x 4, color is good HEENT: EOMI, Neck: supple, no JVD or thyromegaly Chest: CTA bilaterally, no wheezes, crackles, or other adventitious sounds CV: RRR, no m/g/c/r Abd: soft, NT in all quads, ND, +BS in all four quadrants; no HSM, guarding, rigidity, or rebound tenderness Ext: no edema, well perfused with 2+ pulses, Skin: no rash or lesions noted Lymph: no LAD  Data: Lab Results  Component Value Date   WBC 10.2 07/11/2022   HGB 9.7 (L) 07/11/2022   HCT 30.8 (L) 07/11/2022   MCV 100.7 (H) 07/11/2022   PLT 278 07/11/2022   Recent Labs  Lab 07/11/22 0005  HGB 9.7*   Lab Results  Component Value Date   NA 135 07/11/2022   K 4.7 07/11/2022   CL 108 07/11/2022   CO2 20 (L) 07/11/2022   BUN 54 (H) 07/11/2022   CREATININE 1.15 07/11/2022   Lab Results  Component Value Date   ALT 14 07/11/2022   AST 16 07/11/2022   ALKPHOS 40 07/11/2022   BILITOT 0.9 07/11/2022   CLINICAL DATA:  Abdominal pain.  Rectal bleed.   EXAM: CTA ABDOMEN AND PELVIS WITHOUT AND WITH CONTRAST   TECHNIQUE: Multidetector CT imaging of the abdomen and pelvis  was performed using the standard protocol during bolus administration of intravenous contrast. Multiplanar reconstructed images and MIPs were obtained and reviewed to evaluate the vascular anatomy.   RADIATION DOSE REDUCTION: This exam was performed according to the departmental dose-optimization program which includes automated exposure control, adjustment of the mA and/or kV according to patient size and/or use of iterative reconstruction technique.   CONTRAST:  126mL OMNIPAQUE IOHEXOL 350 MG/ML SOLN   COMPARISON:  CT abdomen pelvis dated 01/30/2018.   FINDINGS: VASCULAR   Aorta: Moderate atherosclerotic calcification of the abdominal aorta. No aneurysmal dilatation or dissection. No periaortic fluid collection.   Celiac: Atherosclerotic calcification of the origin of the celiac artery. The celiac axis and its main branches are patent.  SMA: The SMA is patent.   Renals: The renal arteries are patent.   IMA: The IMA is patent.   Inflow: Mild atherosclerotic calcification of the iliac arteries. The iliac arteries are patent.   Proximal Outflow: The visualized proximal outflow appears patent.   Veins: The IVC is unremarkable. No portal venous gas. The SMV, splenic vein, and main portal vein are patent.   Review of the MIP images confirms the above findings.   NON-VASCULAR   Lower chest: Minimal bibasilar atelectasis. The visualized lung bases are otherwise clear. There is coronary vascular calcification.   No intra-abdominal free air or free fluid.   Hepatobiliary: Small liver cysts. No biliary dilatation. The gallbladder is unremarkable.   Pancreas: Unremarkable. No pancreatic ductal dilatation or surrounding inflammatory changes.   Spleen: Normal in size without focal abnormality.   Adrenals/Urinary Tract: The adrenal glands are unremarkable the kidneys, visualized ureters, and urinary bladder appear unremarkable.   Stomach/Bowel: There is moderate sigmoid  diverticulosis without active inflammatory changes. There is no bowel obstruction or active inflammation. No CT evidence of active GI bleed. The appendix is normal.   Lymphatic: No adenopathy.   Reproductive: Enlarged prostate gland measuring 5.5 cm in transverse axial diameter. The seminal vesicles are symmetric.   Other: None   Musculoskeletal: Osteopenia with degenerative changes of the spine and hips. No acute osseous pathology.   IMPRESSION: 1. No acute intra-abdominal or pelvic pathology. No CT evidence of active GI bleed. 2. Moderate sigmoid diverticulosis. No bowel obstruction. Normal appendix. 3. Enlarged prostate gland.     Electronically Signed   By: Anner Crete M.D.   On: 07/11/2022 03:00  Assessment/Plan: Mr. Cancel is a 77 y.o. male is a 77 y.o. male seen for evaluation of GIB at the request of Dr. Blaine Hamper. Patient has a PMH of CAD with stent, HLD, BPH, GERD, arthritis, skin cancer, diverticulosis, colon polyps.  Patient presented to the [facility] ED last night for chief complaint of mild mid abdominal discomfort, black stool with onset last evening of hematochezia and syncope.He became syncopal yesterday with onset of hematochezia.  Differential diagnosis is likely upper GI bleed secondary to Stricker duodenal ulcer, AV malformation malformation, neoplasm. He has reported 7 lb weight loss.    #GI bleed:  -Patient presented with mild upper abdominal discomfort and melanotic stools mixed with red stool.  Acute drop in hemoglobin from 15  to 9.7 to 8.3  along with markedly elevated BUN 54  and normal creatine 1.15. Etiology is likely NSAID induced upper GI bleed causing melena. - VSS and oxygenating well.  - He is not on anti coagulation. - Normal PT/INR. - Pending iron studies and B12.  - CTA showed no active bleeding  # Acute blood loss anemia  Secondary to GI bleed  -Monitor H&H.  Transfusion resuscitation as per primary team. Currently no indication for blood  transfusion.  -Avoid frequent lab draws to prevent lab induced anemia -Supportive care and antiemetics per primary team -Maintain 2 sites IV access -Avoid NSAIDs -Monitor for GI bleed -EGD today with possible biopsy, control bleeding, polypectomy and interventions as necessary.   -NPO.   # Diverticulosis:  -  CTA shows  diverticulosis but no abdominal pain or tenderness to support diagnosis at this time. - No leukocytosis- WBC 10.2  - Recent csy last year is reassuring : IH- non bleeding and sigmoid diverticulosis.   # Hx CAD:  - No CP or SOB -Cardiogenic syncope is possible but more likely had vagal  episode in setting of volume depletion depletion and  GI bleeding. - EKG: no evidence of ischemia or elevated troponin  -Cardiac monitor to evaluate for any arrhythmia.   Thank you for the consult. Please call with questions or concerns.  Denice Paradise, [facility] [phone]

## 2022-07-11 NOTE — Interval H&P Note (Signed)
History and Physical Interval Note: Preprocedure H&P from 07/11/22  was reviewed and there was no interval change after seeing and examining the patient.  Written consent was obtained from the patient after discussion of risks, benefits, and alternatives. Patient has consented to proceed with Esophagogastroduodenoscopy with possible intervention   07/11/2022 11:40 AM  [name]  has presented today for surgery, with the diagnosis of melena, anemia, gi bleed.  The various methods of treatment have been discussed with the patient and family. After consideration of risks, benefits and other options for treatment, the patient has consented to  Procedure(s): ESOPHAGOGASTRODUODENOSCOPY (EGD) WITH PROPOFOL (N/A) as a surgical intervention.  The patient's history has been reviewed, patient examined, no change in status, stable for surgery.  I have reviewed the patient's chart and labs.  Questions were answered to the patient's satisfaction.     Annamaria Helling

## 2022-07-12 ENCOUNTER — Encounter: Payer: Self-pay | Admitting: Gastroenterology

## 2022-07-12 DIAGNOSIS — K264 Chronic or unspecified duodenal ulcer with hemorrhage: Secondary | ICD-10-CM

## 2022-07-12 LAB — CBC
HCT: 24.4 % — ABNORMAL LOW (ref 39.0–52.0)
HCT: 27.1 % — ABNORMAL LOW (ref 39.0–52.0)
Hemoglobin: 8 g/dL — ABNORMAL LOW (ref 13.0–17.0)
Hemoglobin: 8.9 g/dL — ABNORMAL LOW (ref 13.0–17.0)
MCH: 31.4 pg (ref 26.0–34.0)
MCH: 31.6 pg (ref 26.0–34.0)
MCHC: 32.8 g/dL (ref 30.0–36.0)
MCHC: 32.8 g/dL (ref 30.0–36.0)
MCV: 95.7 fL (ref 80.0–100.0)
MCV: 96.1 fL (ref 80.0–100.0)
Platelets: 197 10*3/uL (ref 150–400)
Platelets: 202 10*3/uL (ref 150–400)
RBC: 2.55 MIL/uL — ABNORMAL LOW (ref 4.22–5.81)
RBC: 2.82 MIL/uL — ABNORMAL LOW (ref 4.22–5.81)
RDW: 14.3 % (ref 11.5–15.5)
RDW: 14.4 % (ref 11.5–15.5)
WBC: 7.9 10*3/uL (ref 4.0–10.5)
WBC: 7.8 10*3/uL (ref 4.0–10.5)
nRBC: 0 % (ref 0.0–0.2)
nRBC: 0 % (ref 0.0–0.2)

## 2022-07-12 LAB — TYPE AND SCREEN
ABO/RH(D): A POS
Antibody Screen: NEGATIVE
Unit division: 0

## 2022-07-12 LAB — BASIC METABOLIC PANEL
Anion gap: 5 (ref 5–15)
BUN: 23 mg/dL (ref 8–23)
CO2: 23 mmol/L (ref 22–32)
Calcium: 7.8 mg/dL — ABNORMAL LOW (ref 8.9–10.3)
Chloride: 112 mmol/L — ABNORMAL HIGH (ref 98–111)
Creatinine, Ser: 1.07 mg/dL (ref 0.61–1.24)
GFR, Estimated: >60 mL/min (ref >60)
Glucose, Bld: 109 mg/dL — ABNORMAL HIGH (ref 70–99)
Potassium: 4.2 mmol/L (ref 3.5–5.1)
Sodium: 140 mmol/L (ref 135–145)

## 2022-07-12 LAB — BPAM RBC
Blood Product Expiration Date: 202404302359
ISSUE DATE / TIME: 202404042321
Unit Type and Rh: 6200

## 2022-07-12 MED ORDER — FOLIC ACID 1 MG PO TABS
1.0000 mg | ORAL_TABLET | Freq: Every day | ORAL | Status: DC
  Administered 2022-07-12 – 2022-07-13 (×2): 1 mg via ORAL
  Filled 2022-07-12 (×2): qty 1

## 2022-07-12 MED ORDER — TRAMADOL HCL 50 MG PO TABS: 50.0000 mg | ORAL_TABLET | Freq: Four times a day (QID) | ORAL | Status: DC | PRN

## 2022-07-12 MED ORDER — ENSURE ENLIVE PO LIQD
237.0000 mL | Freq: Two times a day (BID) | ORAL | Status: DC
  Administered 2022-07-12 – 2022-07-13 (×2): 237 mL via ORAL

## 2022-07-12 MED ORDER — CYANOCOBALAMIN 1000 MCG/ML IJ SOLN
1000.0000 ug | Freq: Once | INTRAMUSCULAR | Status: AC
  Administered 2022-07-12: 1000 ug via INTRAMUSCULAR
  Filled 2022-07-12: qty 1

## 2022-07-12 MED ORDER — VITAMIN B-12 1000 MCG PO TABS
1000.0000 ug | ORAL_TABLET | Freq: Every day | ORAL | Status: DC
  Administered 2022-07-13: 1000 ug via ORAL
  Filled 2022-07-12: qty 1

## 2022-07-12 MED ORDER — PANTOPRAZOLE SODIUM 40 MG PO TBEC: 40.0000 mg | DELAYED_RELEASE_TABLET | Freq: Two times a day (BID) | ORAL | Status: DC

## 2022-07-12 MED ORDER — ADULT MULTIVITAMIN W/MINERALS CH
1.0000 | ORAL_TABLET | Freq: Every day | ORAL | Status: DC
  Administered 2022-07-12 – 2022-07-13 (×2): 1 via ORAL
  Filled 2022-07-12 (×2): qty 1

## 2022-07-12 MED ORDER — PANTOPRAZOLE SODIUM 40 MG IV SOLR
40.0000 mg | Freq: Two times a day (BID) | INTRAVENOUS | Status: DC
  Administered 2022-07-12 – 2022-07-13 (×3): 40 mg via INTRAVENOUS
  Filled 2022-07-12 (×3): qty 10

## 2022-07-12 NOTE — Progress Notes (Addendum)
[facility] In[facility] Note  Subjective: Patient is seen for follow up daily visit for UGI bleed, acute symptomatic anemia.  EGD performed yesterday and revealed for non-bleeding cratered duodenal ulcers , clean based and pigmented spots without active bleeding. 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  He has been receiving pantoprazole 40 mg IV twice daily with plans to transition to oral PPI twice daily.  He knows to avoid all NSAIDs going forward.  His diet has been advanced to soft and he is enjoying fish, mashed potatoes, and peas and  tolerating well.  He has no abdominal pain nausea or vomiting or dysphagia.    He had a couple of loose black stools last night. Hgb at baseline 15 and it was 9.7 on admission and  drifted to 7.4 requiring 1 PRBC with Hgb 8.9 this morning. No iron deficiency with ferritin 136 and iron sat 24 .  He passed a formed black stool once this morning. He is looking at his stool hoping to see it turn brown again once the "washout" blood resolves. BUN has returned to normal. He has increased energy and has no chest pain or shortness of breath.  No syncope.   Objective: Vital signs in last 24 hours: Temp:  [97.8 F (36.6 C)-98.7 F (37.1 C)] 98.4 F (36.9 C) (04/05 0805) Pulse Rate:  [69-84] 73 (04/05 0805) Resp:  [16-20] 16 (04/05 0805) BP: (102-132)/(61-73) 132/65 (04/05 0805) SpO2:  [99 %-100 %] 100 % (04/05 0805) Weight:  [75.5 kg] 75.5 kg (04/05 0300) Blood pressure 132/65, pulse 73, temperature 98.4 F (36.9 C), resp. rate 16, height 5\' 9"  (1.753 m), weight 75.5 kg, SpO2 100 %.   Gen: NAD. Appears comfortable.  HEENT: [place]/AT. PERRLA. Normal external ear exam.  Chest: CTA, no wheezes.  CV: RR nl S1, S2. No gallops.  Abd: soft, nt, nd. BS+  Ext: no edema.   Neuro: Alert and oriented. Judgement appears normal. Nonfocal.   Lab Results: Results for orders placed or performed during the  hospital encounter of 07/11/22 (from the past 24 hour(s))  CBC     Status: Abnormal   Collection Time: 07/11/22  2:54 PM  Result Value Ref Range   WBC 11.5 (H) 4.0 - 10.5 K/uL   RBC 2.54 (L) 4.22 - 5.81 MIL/uL   Hemoglobin 8.0 (L) 13.0 - 17.0 g/dL   HCT 96.225.0 (L) 95.239.0 - 84.152.0 %   MCV 98.4 80.0 - 100.0 fL   MCH 31.5 26.0 - 34.0 pg   MCHC 32.0 30.0 - 36.0 g/dL   RDW 32.413.2 40.111.5 - 02.715.5 %   Platelets 222 150 - 400 K/uL   nRBC 0.0 0.0 - 0.2 %  ABO/Rh     Status: None   Collection Time: 07/11/22  2:54 PM  Result Value Ref Range   ABO/RH(D)      A POS Performed at [facility], [address] [place], [place] [id number]   CBC     Status: Abnormal   Collection Time: 07/11/22  7:58 PM  Result Value Ref Range   WBC 11.1 (H) 4.0 - 10.5 K/uL   RBC 2.32 (L) 4.22 - 5.81 MIL/uL   Hemoglobin 7.4 (L) 13.0 - 17.0 g/dL   HCT 44.022.8 (L) 34.739.0 - 42.552.0 %   MCV 98.3 80.0 - 100.0 fL   MCH 31.9 26.0 - 34.0 pg   MCHC 32.5 30.0 - 36.0 g/dL   RDW 95.613.2 38.711.5 -  15.5 %   Platelets 218 150 - 400 K/uL   nRBC 0.0 0.0 - 0.2 %  Prepare RBC (crossmatch)     Status: None   Collection Time: 07/11/22  9:33 PM  Result Value Ref Range   Order Confirmation      ORDER PROCESSED BY BLOOD BANK Performed at [facility], [address] [place], [place] [address]   CBC     Status: Abnormal   Collection Time: 07/12/22  5:04 AM  Result Value Ref Range   WBC 7.9 4.0 - 10.5 K/uL   RBC 2.55 (L) 4.22 - 5.81 MIL/uL   Hemoglobin 8.0 (L) 13.0 - 17.0 g/dL   HCT 46.5 (L) 68.1 - 27.5 %   MCV 95.7 80.0 - 100.0 fL   MCH 31.4 26.0 - 34.0 pg   MCHC 32.8 30.0 - 36.0 g/dL   RDW 17.0 01.7 - 49.4 %   Platelets 197 150 - 400 K/uL   nRBC 0.0 0.0 - 0.2 %  Basic metabolic panel     Status: Abnormal   Collection Time: 07/12/22  5:04 AM  Result Value Ref Range   Sodium 140 135 - 145 mmol/L   Potassium 4.2 3.5 - 5.1 mmol/L   Chloride 112 (H) 98 - 111 mmol/L   CO2 23 22 - 32 mmol/L   Glucose, Bld 109 (H) 70 - 99 mg/dL    BUN 23 8 - 23 mg/dL   Creatinine, Ser 4.96 0.61 - 1.24 mg/dL   Calcium 7.8 (L) 8.9 - 10.3 mg/dL   GFR, Estimated >75 >91 mL/min   Anion gap 5 5 - 15  CBC     Status: Abnormal   Collection Time: 07/12/22 10:39 AM  Result Value Ref Range   WBC 7.8 4.0 - 10.5 K/uL   RBC 2.82 (L) 4.22 - 5.81 MIL/uL   Hemoglobin 8.9 (L) 13.0 - 17.0 g/dL   HCT 63.8 (L) 46.6 - 59.9 %   MCV 96.1 80.0 - 100.0 fL   MCH 31.6 26.0 - 34.0 pg   MCHC 32.8 30.0 - 36.0 g/dL   RDW 35.7 01.7 - 79.3 %   Platelets 202 150 - 400 K/uL   nRBC 0.0 0.0 - 0.2 %     Recent Labs    07/11/22 1958 07/12/22 0504 07/12/22 1039  WBC 11.1* 7.9 7.8  HGB 7.4* 8.0* 8.9*  HCT 22.8* 24.4* 27.1*  PLT 218 197 202   BMET Recent Labs    07/11/22 0005 07/12/22 0504  NA 135 140  K 4.7 4.2  CL 108 112*  CO2 20* 23  GLUCOSE 126* 109*  BUN 54* 23  CREATININE 1.15 1.07  CALCIUM 8.3* 7.8*   LFT Recent Labs    07/11/22 0005  PROT 5.7*  ALBUMIN 3.0*  AST 16  ALT 14  ALKPHOS 40  BILITOT 0.9   PT/INR Recent Labs    07/11/22 0824  LABPROT 14.7  INR 1.2   Studies/Results: CT ANGIO GI BLEED  Result Date: 07/11/2022 CLINICAL DATA:  Abdominal pain.  Rectal bleed. EXAM: CTA ABDOMEN AND PELVIS WITHOUT AND WITH CONTRAST TECHNIQUE: Multidetector CT imaging of the abdomen and pelvis was performed using the standard protocol during bolus administration of intravenous contrast. Multiplanar reconstructed images and MIPs were obtained and reviewed to evaluate the vascular anatomy. RADIATION DOSE REDUCTION: This exam was performed according to the departmental dose-optimization program which includes automated exposure control, adjustment of the mA and/or kV according to patient size and/or use of  iterative reconstruction technique. CONTRAST:  100mL OMNIPAQUE IOHEXOL 350 MG/ML SOLN COMPARISON:  CT abdomen pelvis dated 01/30/2018. FINDINGS: VASCULAR Aorta: Moderate atherosclerotic calcification of the abdominal aorta. No aneurysmal  dilatation or dissection. No periaortic fluid collection. Celiac: Atherosclerotic calcification of the origin of the celiac artery. The celiac axis and its main branches are patent. SMA: The SMA is patent. Renals: The renal arteries are patent. IMA: The IMA is patent. Inflow: Mild atherosclerotic calcification of the iliac arteries. The iliac arteries are patent. Proximal Outflow: The visualized proximal outflow appears patent. Veins: The IVC is unremarkable. No portal venous gas. The SMV, splenic vein, and main portal vein are patent. Review of the MIP images confirms the above findings. NON-VASCULAR Lower chest: Minimal bibasilar atelectasis. The visualized lung bases are otherwise clear. There is coronary vascular calcification. No intra-abdominal free air or free fluid. Hepatobiliary: Small liver cysts. No biliary dilatation. The gallbladder is unremarkable. Pancreas: Unremarkable. No pancreatic ductal dilatation or surrounding inflammatory changes. Spleen: Normal in size without focal abnormality. Adrenals/Urinary Tract: The adrenal glands are unremarkable the kidneys, visualized ureters, and urinary bladder appear unremarkable. Stomach/Bowel: There is moderate sigmoid diverticulosis without active inflammatory changes. There is no bowel obstruction or active inflammation. No CT evidence of active GI bleed. The appendix is normal. Lymphatic: No adenopathy. Reproductive: Enlarged prostate gland measuring 5.5 cm in transverse axial diameter. The seminal vesicles are symmetric. Other: None Musculoskeletal: Osteopenia with degenerative changes of the spine and hips. No acute osseous pathology. IMPRESSION: 1. No acute intra-abdominal or pelvic pathology. No CT evidence of active GI bleed. 2. Moderate sigmoid diverticulosis. No bowel obstruction. Normal appendix. 3. Enlarged prostate gland. Electronically Signed   By: Elgie CollardArash  Radparvar M.D.   On: 07/11/2022 03:00    Scheduled Inpatient Medications:    [START ON  07/13/2022] vitamin B-12  1,000 mcg Oral Daily   feeding supplement  237 mL Oral BID BM   folic acid  1 mg Oral Daily   multivitamin with minerals  1 tablet Oral Daily   pantoprazole (PROTONIX) IV  40 mg Intravenous Q12H   Followed by   Melene Muller[START ON 07/14/2022] pantoprazole  40 mg Oral BID   simvastatin  40 mg Oral Daily   tamsulosin  0.4 mg Oral Daily    Continuous Inpatient Infusions:    sodium chloride 100 mL/hr at 07/12/22 0203   erythromycin      PRN Inpatient Medications:  acetaminophen, ondansetron (ZOFRAN) IV, oxyCODONE   Assessment:  Patient presented with mid abdominal discomfort,  black stool, hematochezia, syncope, and acute blood loss anemia.  Differential diagnosis included gastric ulcer and duodenal ulcer.   EGD performed yesterday: # Multiple non-bleeding cratered duodenal ulcers , clean based and pigmented spots without active bleeding. 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  - He has been receiving pantoprazole 40 mg IV twice daily with plans to transition to oral PPI twice daily.  -  He knows to avoid all NSAIDs going forward.   - His diet has been advanced to soft and he is enjoying fish, mashed potatoes, and peas and  tolerating well.    Acute blood loss anemia - Monitor H&H.Hgb is improving after transfusion. Expect melena to improve. Avoid frequent lab draws to prevent lab induced anemia - No iron deficiency   - Avoid NSAIDs - Monitor for GIB. - Hold dvt ppx - If rebleed occurs consider GDA embolization with vascular surgery  Portions of the record may have been created  with voice recognition software. Occasional wrong-word or 'sound-a-like' substitutions may have occurred due to the inherent limitations of voice recognition software.  Read the chart carefully and recognize, using context, where substitutions may have occurred.   Rowan Blase, ANP @KM @, 1:48 PM

## 2022-07-12 NOTE — Progress Notes (Signed)
Initial Nutrition Assessment  DOCUMENTATION CODES:   Not applicable  INTERVENTION:   -Ensure Enlive po BID, each supplement provides 350 kcal and 20 grams of protein -MVI with minerals daily  NUTRITION DIAGNOSIS:   Increased nutrient needs related to acute illness as evidenced by estimated needs.  GOAL:   Patient will meet greater than or equal to 90% of their needs  MONITOR:   PO intake, Supplement acceptance, Diet advancement  REASON FOR ASSESSMENT:   Malnutrition Screening Tool    ASSESSMENT:   Pt with medical history significant of CAD with stent placement, HLD, BPH, GERD, arthtitis, skin cancer, who presents with GI bleeding.  Pt admitted with GI bleeding and acute blood loss anemia.   4/4- s/p EGD- revealed benign appearing esophageal stenosis, small hiatal hernia, non-bleeding duodenal ulcers  Reviewed I/O's: +2.2 L x 24 hours  UOP: 825 ml x 24 hours  Per GI notes, pt rebleeding occurs may require repeat EGD vs GDA embolization.   Spoke with pt at bedside, who was pleasant and in good spirits today. Pt reports feeling better and is hungry, eager to eat. Pt with good appetite PTA, he  reports he generally consumes 2 meals per day (Lunch: sandwich, Dinner: meat, starch, and vegetable). Pt also snacks on potato chips when he watches TV.   Reviewed wt hx. Per CareEverywhere, wt was 178# on 02/05/22 encounter. Pt has experienced a 6.7% wt loss over the past 5 months, which is not significant for time frame. Pt reports his UBW is around 178# and estimates he has lost about 7# over the past month, which he attributes to acute illness.   Pt is eager to eat; noted diet was advanced to soft after RD visit. Discussed importance of good meal and supplement intake to promote healing. Pt amenable to supplements.   Medications reviewed and include vitamin B-12, folic acid, and 0-9% sodium chloride infusion @ 100 ml/hr.   Labs reviewed.   NUTRITION - FOCUSED PHYSICAL  EXAM:  Flowsheet Row Most Recent Value  Orbital Region No depletion  Upper Arm Region Mild depletion  Thoracic and Lumbar Region No depletion  Buccal Region No depletion  Temple Region No depletion  Clavicle Bone Region No depletion  Clavicle and Acromion Bone Region No depletion  Scapular Bone Region No depletion  Dorsal Hand No depletion  Patellar Region Mild depletion  Anterior Thigh Region Mild depletion  Posterior Calf Region Mild depletion  Edema (RD Assessment) None  Hair Reviewed  Eyes Reviewed  Mouth Reviewed  Skin Reviewed  Nails Reviewed       Diet Order:   Diet Order             DIET SOFT Room service appropriate? Yes; Fluid consistency: Thin  Diet effective now                   EDUCATION NEEDS:   Education needs have been addressed  Skin:  Skin Assessment: Reviewed RN Assessment  Last BM:  07/12/22 (type 4)  Height:   Ht Readings from Last 1 Encounters:  07/10/22 5\' 9"  (1.753 m)    Weight:   Wt Readings from Last 1 Encounters:  07/12/22 75.5 kg    Ideal Body Weight:  72.7 kg  BMI:  Body mass index is 24.58 kg/m.  Estimated Nutritional Needs:   Kcal:  1850-2050  Protein:  100-115 grams  Fluid:  > 1.8 L    Levada Schilling, RD, LDN, CDCES Registered Dietitian II Certified Diabetes Care  and Education Specialist Please refer to [facility] for RD and/or RD on-call/weekend/after hours pager

## 2022-07-12 NOTE — Assessment & Plan Note (Signed)
Likely due to GI bleed and hypovolemic status.  Resolved

## 2022-07-12 NOTE — Assessment & Plan Note (Addendum)
-  As needed Tylenol and tramadol -Avoid NSAID

## 2022-07-12 NOTE — Assessment & Plan Note (Signed)
Secondary to GI bleed.  S/p 1 unit of PRBC.  Hemoglobin improved to 8. Anemia panel with B12 deficiency and borderline folic acid. -Give 1 dose of IM B12 supplement followed by p.o. supplement -Start him on folic acid supplement -Monitor hemoglobin

## 2022-07-12 NOTE — Assessment & Plan Note (Signed)
-  Continue Zocor -No aspirin due to GI bleed

## 2022-07-12 NOTE — Plan of Care (Signed)

## 2022-07-12 NOTE — Assessment & Plan Note (Signed)
Continue Zocor 

## 2022-07-12 NOTE — Hospital Course (Addendum)
Taken from H&P.  Fred Perkins is a 77 y.o. male with medical history significant of CAD with stent placement, HLD, BPH, GERD, arthtitis, skin cancer, who presents with GI bleeding.    Pt states that he has dark-colored stool for 3-4 days. Since yesterday, started having bright red blood in his stool. Patient denies abdominal pain, but states that he has some abdominal " grumbling".  No nausea and vomiting.Patient states that he developed lightheadedness and passed out last night for few seconds, no injuries. Of note, pt is taking NSAIDs, diclofenac and ibuprofen, also used the prednisone recently.   Data reviewed independently and ED Course: pt was found to have  Hgb 15.3 on 06/17/22 --> 9.7 --> 8.4, WBC 10.2, GFR> 60, temperature normal, blood pressure 136/66, heart rate 84, RR 18, oxygen saturation 99% on room air.  CTA per GI bleeding protocol is negative for active bleeding, but showed moderate sigmoid diverticulosis.   EKG:  Sinus rhythm, bifascicular block, early R wave progression, QTc 466   Patient underwent EGD by gastroenterology, that shows 4 nonbleeding duodenal ulcers with flat pigmented spot (Forrest class IIc), and small hiatal hernia was noted.  GI is recommending overnight IV PPI followed by p.o. twice daily for 8 weeks. Avoid all NSAID use.  Patient also received 1 unit of PRBC.  4/5: Hemoglobin this morning at 8, leukocytosis resolved.  Anemia panel with B12 deficiency, iron and ferritin normal and borderline folate.  Given 1 dose of IM B12 and started on p.o. supplement.  Had 1 more black-colored bowel movement although hemoglobin now stable. Likely remaining blood from the prior bleed. Will observe for another night.  4/6: Hemodynamically stable.  Hemoglobin stable at 8.  Bowel movement started clearing of, no more active bleeding.  Patient is being discharged on vitamin B12 and folic acid supplement along with twice daily Prilosec 40 mg as advised by GI for 2 months.  He  was also given tramadol to be used if Tylenol does not work for his arthritis.  He need to follow-up with his PCP for further management of his arthritis and should avoid all NSAID.  Patient will continue on current medications and need to have a close follow-up with his providers.

## 2022-07-12 NOTE — TOC Initial Note (Signed)
Transition of Care [facility]) - Initial/Assessment Note    Patient Details  Name: Fred Perkins MRN: [id number] Date of Birth: [birth year]  Transition of Care [facility]) CM/SW Contact:    Truddie Hidden, RN Phone Number: 07/12/2022, 2:30 PM  Clinical Narrative:                  Transition of Care [facility]) Screening Note   Patient Details  Name: Fred Perkins Date of Birth: 1945/06/26   Transition of Care [facility]) CM/SW Contact:    Truddie Hidden, RN Phone Number: 07/12/2022, 2:30 PM    Transition of Care Department [facility]) has reviewed patient and no TOC needs have been identified at this time. We will continue to monitor patient advancement through interdisciplinary progression rounds. If new patient transition needs arise, please place a TOC consult.          Patient Goals and CMS Choice            Expected Discharge Plan and Services                                              Prior Living Arrangements/Services                       Activities of Daily Living Home Assistive Devices/Equipment: None ADL Screening (condition at time of admission) Patient's cognitive ability adequate to safely complete daily activities?: Yes Is the patient deaf or have difficulty hearing?: No Does the patient have difficulty seeing, even when wearing glasses/contacts?: No Does the patient have difficulty concentrating, remembering, or making decisions?: No Patient able to express need for assistance with ADLs?: Yes Does the patient have difficulty dressing or bathing?: No Independently performs ADLs?: Yes (appropriate for developmental age) Does the patient have difficulty walking or climbing stairs?: No Weakness of Legs: None Weakness of Arms/Hands: None  Permission Sought/Granted                  Emotional Assessment              Admission diagnosis:  Syncope and collapse [R55] Melena [K92.1] GI bleeding [K92.2] Acute GI bleeding [K92.2] Anemia,  unspecified type [D64.9] Patient Active Problem List   Diagnosis Date Noted   GI bleeding 07/11/2022   BPH (benign prostatic hyperplasia) 07/11/2022   Arthritis 07/11/2022   Acute blood loss anemia 07/11/2022   Syncope 07/11/2022   HLD (hyperlipidemia) 07/11/2022   CAD (coronary artery disease) 07/11/2022   PCP:  Danella Penton, MD Pharmacy:   [facility] [id number] [place], [place] - [address] [place] [address] Phone: [phone] Fax: [phone]     Social Determinants of Health (SDOH) Social History: SDOH Screenings   Food Insecurity: No Food Insecurity (07/11/2022)  Housing: Low Risk  (07/11/2022)  Transportation Needs: No Transportation Needs (07/11/2022)  Utilities: Not At Risk (07/11/2022)  Tobacco Use: Low Risk  (07/12/2022)   SDOH Interventions:     Readmission Risk Interventions     No data to display

## 2022-07-12 NOTE — Assessment & Plan Note (Signed)
-   Continue Flomax 

## 2022-07-12 NOTE — Assessment & Plan Note (Signed)
EGD with known bleeding for duodenal ulcers.  Most likely secondary to NSAID and recent steroid use.  Patient was taking higher doses of ibuprofen for the past 1 week.  S/p 1 unit of PRBC. -Switching to p.o. Prilosec 40 mg twice daily for 8 weeks from tomorrow -Avoid NSAID -Outpatient follow-up

## 2022-07-12 NOTE — Progress Notes (Signed)
Progress Note   Patient: Fred Perkins KKX:[id number] DOB: Jun 20, 1945 DOA: 07/11/2022     1 DOS: the patient was seen and examined on 07/12/2022   Brief hospital course: Taken from H&P.  Fred Perkins is a 77 y.o. male with medical history significant of CAD with stent placement, HLD, BPH, GERD, arthtitis, skin cancer, who presents with GI bleeding.    Pt states that he has dark-colored stool for 3-4 days. Since yesterday, started having bright red blood in his stool. Patient denies abdominal pain, but states that he has some abdominal " grumbling".  No nausea and vomiting.Patient states that he developed lightheadedness and passed out last night for few seconds, no injuries. Of note, pt is taking NSAIDs, diclofenac and ibuprofen, also used the prednisone recently.   Data reviewed independently and ED Course: pt was found to have  Hgb 15.3 on 06/17/22 --> 9.7 --> 8.4, WBC 10.2, GFR> 60, temperature normal, blood pressure 136/66, heart rate 84, RR 18, oxygen saturation 99% on room air.  CTA per GI bleeding protocol is negative for active bleeding, but showed moderate sigmoid diverticulosis.   EKG:  Sinus rhythm, bifascicular block, early R wave progression, QTc 466   Patient underwent EGD by gastroenterology, that shows 4 nonbleeding duodenal ulcers with flat pigmented spot (Forrest class IIc), and small hiatal hernia was noted.  GI is recommending overnight IV PPI followed by p.o. twice daily for 8 weeks. Avoid all NSAID use.  Patient also received 1 unit of PRBC.  4/5: Hemoglobin this morning at 8, leukocytosis resolved.  Anemia panel with B12 deficiency, iron and ferritin normal and borderline folate.  Given 1 dose of IM B12 and started on p.o. supplement.  Had 1 more black-colored bowel movement although hemoglobin now stable. Likely remaining blood from the prior bleed. Will observe for another night.    Assessment and Plan: * GI bleeding EGD with known bleeding for duodenal ulcers.   Most likely secondary to NSAID and recent steroid use.  Patient was taking higher doses of ibuprofen for the past 1 week.  S/p 1 unit of PRBC. -Switching to p.o. Prilosec 40 mg twice daily for 8 weeks from tomorrow -Avoid NSAID -Outpatient follow-up  Acute blood loss anemia Secondary to GI bleed.  S/p 1 unit of PRBC.  Hemoglobin improved to 8. Anemia panel with B12 deficiency and borderline folic acid. -Give 1 dose of IM B12 supplement followed by p.o. supplement -Start him on folic acid supplement -Monitor hemoglobin  Syncope Likely due to GI bleed and hypovolemic status.  Resolved  CAD (coronary artery disease) -Continue Zocor -No aspirin due to GI bleed  HLD (hyperlipidemia) -Continue Zocor  BPH (benign prostatic hyperplasia) -Continue Flomax  Arthritis -As needed Tylenol and tramadol -Avoid NSAID        Subjective: Patient was seen and examined today.  Had 1 black-colored bowel movement.  Physical Exam: Vitals:   07/12/22 0205 07/12/22 0300 07/12/22 0500 07/12/22 0805  BP: 102/68  127/73 132/65  Pulse: 69  77 73  Resp: 16  18 16   Temp: 98.4 F (36.9 C)  98.6 F (37 C) 98.4 F (36.9 C)  TempSrc: Oral  Oral   SpO2: 99%  99% 100%  Weight:  75.5 kg    Height:       General.  Well-developed elderly man, in no acute distress. Pulmonary.  Lungs clear bilaterally, normal respiratory effort. CV.  Regular rate and rhythm, no JVD, rub or murmur. Abdomen.  Soft, nontender, nondistended,  BS positive. CNS.  Alert and oriented .  No focal neurologic deficit. Extremities.  No edema, no cyanosis, pulses intact and symmetrical. Psychiatry.  Judgment and insight appears normal.   Data Reviewed: Prior data reviewed  Family Communication:   Disposition: Status is: Inpatient Remains inpatient appropriate because: Severity of illness  Planned Discharge Destination: Home  Time spent: 50 minutes  This record has been created using Conservation officer, historic buildingsDragon voice recognition software.  Errors have been sought and corrected,but may not always be located. Such creation errors do not reflect on the standard of care.   Author: Arnetha CourserSumayya Grayson Pfefferle, MD 07/12/2022 3:45 PM  For on call review [email or web address].

## 2022-07-13 DIAGNOSIS — D649 Anemia, unspecified: Secondary | ICD-10-CM | POA: Diagnosis not present

## 2022-07-13 DIAGNOSIS — K921 Melena: Secondary | ICD-10-CM | POA: Diagnosis not present

## 2022-07-13 DIAGNOSIS — N400 Enlarged prostate without lower urinary tract symptoms: Secondary | ICD-10-CM

## 2022-07-13 DIAGNOSIS — K264 Chronic or unspecified duodenal ulcer with hemorrhage: Secondary | ICD-10-CM | POA: Diagnosis not present

## 2022-07-13 DIAGNOSIS — R55 Syncope and collapse: Secondary | ICD-10-CM | POA: Diagnosis not present

## 2022-07-13 LAB — CBC
HCT: 24.1 % — ABNORMAL LOW (ref 39.0–52.0)
Hemoglobin: 8 g/dL — ABNORMAL LOW (ref 13.0–17.0)
MCH: 31.6 pg (ref 26.0–34.0)
MCHC: 33.2 g/dL (ref 30.0–36.0)
MCV: 95.3 fL (ref 80.0–100.0)
Platelets: 207 10*3/uL (ref 150–400)
RBC: 2.53 MIL/uL — ABNORMAL LOW (ref 4.22–5.81)
RDW: 13.9 % (ref 11.5–15.5)
WBC: 8.5 10*3/uL (ref 4.0–10.5)
nRBC: 0 % (ref 0.0–0.2)

## 2022-07-13 LAB — GLUCOSE, CAPILLARY: Glucose-Capillary: 96 mg/dL (ref 70–99)

## 2022-07-13 MED ORDER — ADULT MULTIVITAMIN W/MINERALS CH: 1.0000 | ORAL_TABLET | Freq: Every day | ORAL | 1 refills | Status: AC

## 2022-07-13 MED ORDER — TRAMADOL HCL 50 MG PO TABS: 50.0000 mg | ORAL_TABLET | Freq: Four times a day (QID) | ORAL | 0 refills | Status: DC | PRN

## 2022-07-13 MED ORDER — OMEPRAZOLE 40 MG PO CPDR: 40.0000 mg | DELAYED_RELEASE_CAPSULE | Freq: Two times a day (BID) | ORAL | 1 refills | Status: DC

## 2022-07-13 MED ORDER — FOLIC ACID 1 MG PO TABS: 1.0000 mg | ORAL_TABLET | Freq: Every day | ORAL | 1 refills | Status: AC

## 2022-07-13 MED ORDER — ENSURE ENLIVE PO LIQD: 237.0000 mL | Freq: Two times a day (BID) | ORAL | 12 refills | Status: DC

## 2022-07-13 MED ORDER — CYANOCOBALAMIN 1000 MCG PO TABS: 1000.0000 ug | ORAL_TABLET | Freq: Every day | ORAL | 1 refills | Status: DC

## 2022-07-13 NOTE — Discharge Summary (Signed)
Physician Discharge Summary   Patient: Fred Perkins MRN: [id number] DOB: [birth year]  Admit date:     07/11/2022  Discharge date: 07/13/22  Discharge Physician: Arnetha Courser   PCP: Danella Penton, MD   Recommendations at discharge:  Please obtain CBC and BMP in 1 week Follow-up with primary care provider within a week Patient should avoid all NSAID  Discharge Diagnoses: Principal Problem:   GI bleeding Active Problems:   Acute blood loss anemia   Syncope and collapse   CAD (coronary artery disease)   HLD (hyperlipidemia)   BPH (benign prostatic hyperplasia)   Arthritis   Anemia   Melena   Hospital Course: Taken from H&P.  Fred Perkins is a 77 y.o. male with medical history significant of CAD with stent placement, HLD, BPH, GERD, arthtitis, skin cancer, who presents with GI bleeding.    Pt states that he has dark-colored stool for 3-4 days. Since yesterday, started having bright red blood in his stool. Patient denies abdominal pain, but states that he has some abdominal " grumbling".  No nausea and vomiting.Patient states that he developed lightheadedness and passed out last night for few seconds, no injuries. Of note, pt is taking NSAIDs, diclofenac and ibuprofen, also used the prednisone recently.   Data reviewed independently and ED Course: pt was found to have  Hgb 15.3 on 06/17/22 --> 9.7 --> 8.4, WBC 10.2, GFR> 60, temperature normal, blood pressure 136/66, heart rate 84, RR 18, oxygen saturation 99% on room air.  CTA per GI bleeding protocol is negative for active bleeding, but showed moderate sigmoid diverticulosis.   EKG:  Sinus rhythm, bifascicular block, early R wave progression, QTc 466   Patient underwent EGD by gastroenterology, that shows 4 nonbleeding duodenal ulcers with flat pigmented spot (Forrest class IIc), and small hiatal hernia was noted.  GI is recommending overnight IV PPI followed by p.o. twice daily for 8 weeks. Avoid all NSAID use.  Patient also  received 1 unit of PRBC.  4/5: Hemoglobin this morning at 8, leukocytosis resolved.  Anemia panel with B12 deficiency, iron and ferritin normal and borderline folate.  Given 1 dose of IM B12 and started on p.o. supplement.  Had 1 more black-colored bowel movement although hemoglobin now stable. Likely remaining blood from the prior bleed. Will observe for another night.  4/6: Hemodynamically stable.  Hemoglobin stable at 8.  Bowel movement started clearing of, no more active bleeding.  Patient is being discharged on vitamin B12 and folic acid supplement along with twice daily Prilosec 40 mg as advised by GI for 2 months.  He was also given tramadol to be used if Tylenol does not work for his arthritis.  He need to follow-up with his PCP for further management of his arthritis and should avoid all NSAID.  Patient will continue on current medications and need to have a close follow-up with his providers.  Assessment and Plan: * GI bleeding EGD with known bleeding for duodenal ulcers.  Most likely secondary to NSAID and recent steroid use.  Patient was taking higher doses of ibuprofen for the past 1 week.  S/p 1 unit of PRBC. -Switching to p.o. Prilosec 40 mg twice daily for 8 weeks from tomorrow -Avoid NSAID -Outpatient follow-up  Acute blood loss anemia Secondary to GI bleed.  S/p 1 unit of PRBC.  Hemoglobin improved to 8. Anemia panel with B12 deficiency and borderline folic acid. -Give 1 dose of IM B12 supplement followed by p.o. supplement -Start him  on folic acid supplement -Monitor hemoglobin  Syncope and collapse Likely due to GI bleed and hypovolemic status.  Resolved  CAD (coronary artery disease) -Continue Zocor -No aspirin due to GI bleed  HLD (hyperlipidemia) -Continue Zocor  BPH (benign prostatic hyperplasia) -Continue Flomax  Arthritis -As needed Tylenol and tramadol -Avoid NSAID        Pain control - [place] Controlled Substance Reporting System  database was reviewed. and patient was instructed, not to drive, operate heavy machinery, perform activities at heights, swimming or participation in water activities or provide baby-sitting services while on Pain, Sleep and Anxiety Medications; until their outpatient Physician has advised to do so again. Also recommended to not to take more than prescribed Pain, Sleep and Anxiety Medications.  Consultants: Gastroenterology Procedures performed: EGD Disposition: Home Diet recommendation:  Discharge Diet Orders (From admission, onward)     Start     Ordered   07/13/22 0000  Diet - low sodium heart healthy        07/13/22 1035           Cardiac diet DISCHARGE MEDICATION: Allergies as of 07/13/2022       Reactions   Diclofenac Nausea And Vomiting   Meloxicam Nausea And Vomiting   Sudafed [pseudoephedrine] Other (See Comments)        Medication List     STOP taking these medications    ibuprofen 800 MG tablet Commonly known as: ADVIL   pantoprazole 40 MG tablet Commonly known as: PROTONIX   predniSONE 20 MG tablet Commonly known as: DELTASONE       TAKE these medications    cyanocobalamin 1000 MCG tablet Take 1 tablet (1,000 mcg total) by mouth daily.   feeding supplement Liqd Take 237 mLs by mouth 2 (two) times daily between meals.   folic acid 1 MG tablet Commonly known as: FOLVITE Take 1 tablet (1 mg total) by mouth daily.   multivitamin with minerals Tabs tablet Take 1 tablet by mouth daily.   omeprazole 40 MG capsule Commonly known as: PRILOSEC Take 1 capsule (40 mg total) by mouth in the morning and at bedtime.   simvastatin 40 MG tablet Commonly known as: ZOCOR Take 40 mg by mouth daily.   tamsulosin 0.4 MG Caps capsule Commonly known as: FLOMAX Take 0.4 mg by mouth daily.   traMADol 50 MG tablet Commonly known as: ULTRAM Take 1 tablet (50 mg total) by mouth every 6 (six) hours as needed for severe pain.        Follow-up Information      Danella Penton, MD. Schedule an appointment as soon as possible for a visit in 1 week(s).   Specialty: Internal Medicine Contact information: [address] [facility] [place] [address] [phone]                Discharge Exam: Filed Weights   07/10/22 2342 07/12/22 0300 07/13/22 0408  Weight: 72.6 kg 75.5 kg 74.7 kg   General.     In no acute distress. Pulmonary.  Lungs clear bilaterally, normal respiratory effort. CV.  Regular rate and rhythm, no JVD, rub or murmur. Abdomen.  Soft, nontender, nondistended, BS positive. CNS.  Alert and oriented .  No focal neurologic deficit. Extremities.  No edema, no cyanosis, pulses intact and symmetrical. Psychiatry.  Judgment and insight appears normal.   Condition at discharge: stable  The results of significant diagnostics from this hospitalization (including imaging, microbiology, ancillary and laboratory) are listed below  for reference.   Imaging Studies: CT ANGIO GI BLEED  Result Date: 07/11/2022 CLINICAL DATA:  Abdominal pain.  Rectal bleed. EXAM: CTA ABDOMEN AND PELVIS WITHOUT AND WITH CONTRAST TECHNIQUE: Multidetector CT imaging of the abdomen and pelvis was performed using the standard protocol during bolus administration of intravenous contrast. Multiplanar reconstructed images and MIPs were obtained and reviewed to evaluate the vascular anatomy. RADIATION DOSE REDUCTION: This exam was performed according to the departmental dose-optimization program which includes automated exposure control, adjustment of the mA and/or kV according to patient size and/or use of iterative reconstruction technique. CONTRAST:  100mL OMNIPAQUE IOHEXOL 350 MG/ML SOLN COMPARISON:  CT abdomen pelvis dated 01/30/2018. FINDINGS: VASCULAR Aorta: Moderate atherosclerotic calcification of the abdominal aorta. No aneurysmal dilatation or dissection. No periaortic fluid collection. Celiac: Atherosclerotic calcification of  the origin of the celiac artery. The celiac axis and its main branches are patent. SMA: The SMA is patent. Renals: The renal arteries are patent. IMA: The IMA is patent. Inflow: Mild atherosclerotic calcification of the iliac arteries. The iliac arteries are patent. Proximal Outflow: The visualized proximal outflow appears patent. Veins: The IVC is unremarkable. No portal venous gas. The SMV, splenic vein, and main portal vein are patent. Review of the MIP images confirms the above findings. NON-VASCULAR Lower chest: Minimal bibasilar atelectasis. The visualized lung bases are otherwise clear. There is coronary vascular calcification. No intra-abdominal free air or free fluid. Hepatobiliary: Small liver cysts. No biliary dilatation. The gallbladder is unremarkable. Pancreas: Unremarkable. No pancreatic ductal dilatation or surrounding inflammatory changes. Spleen: Normal in size without focal abnormality. Adrenals/Urinary Tract: The adrenal glands are unremarkable the kidneys, visualized ureters, and urinary bladder appear unremarkable. Stomach/Bowel: There is moderate sigmoid diverticulosis without active inflammatory changes. There is no bowel obstruction or active inflammation. No CT evidence of active GI bleed. The appendix is normal. Lymphatic: No adenopathy. Reproductive: Enlarged prostate gland measuring 5.5 cm in transverse axial diameter. The seminal vesicles are symmetric. Other: None Musculoskeletal: Osteopenia with degenerative changes of the spine and hips. No acute osseous pathology. IMPRESSION: 1. No acute intra-abdominal or pelvic pathology. No CT evidence of active GI bleed. 2. Moderate sigmoid diverticulosis. No bowel obstruction. Normal appendix. 3. Enlarged prostate gland. Electronically Signed   By: Elgie CollardArash  Radparvar M.D.   On: 07/11/2022 03:00    Microbiology: Results for orders placed or performed in visit on 10/20/19  Microscopic Examination     Status: Abnormal   Collection Time:  10/20/19  3:17 PM   Urine  Result Value Ref Range Status   WBC, UA 6-10 (A) 0 - 5 /hpf Final   RBC, Urine 0-2 0 - 2 /hpf Final   Epithelial Cells (non renal) 0-10 0 - 10 /hpf Final   Casts Present (A) None seen /lpf Final   Cast Type Hyaline casts N/A Final   Bacteria, UA None seen None seen/Few Final    Labs: CBC: Recent Labs  Lab 07/11/22 0005 07/11/22 0824 07/11/22 1454 07/11/22 1958 07/12/22 0504 07/12/22 1039 07/13/22 0526  WBC 10.2   < > 11.5* 11.1* 7.9 7.8 8.5  NEUTROABS 7.6  --   --   --   --   --   --   HGB 9.7*   < > 8.0* 7.4* 8.0* 8.9* 8.0*  HCT 30.8*   < > 25.0* 22.8* 24.4* 27.1* 24.1*  MCV 100.7*   < > 98.4 98.3 95.7 96.1 95.3  PLT 278   < > 222 218 197 202 207   < > =  values in this interval not displayed.   Basic Metabolic Panel: Recent Labs  Lab 07/11/22 0005 07/12/22 0504  NA 135 140  K 4.7 4.2  CL 108 112*  CO2 20* 23  GLUCOSE 126* 109*  BUN 54* 23  CREATININE 1.15 1.07  CALCIUM 8.3* 7.8*   Liver Function Tests: Recent Labs  Lab 07/11/22 0005  AST 16  ALT 14  ALKPHOS 40  BILITOT 0.9  PROT 5.7*  ALBUMIN 3.0*   CBG: Recent Labs  Lab 07/13/22 0845  GLUCAP 96    Discharge time spent: greater than 30 minutes.  This record has been created using Conservation officer, historic buildings. Errors have been sought and corrected,but may not always be located. Such creation errors do not reflect on the standard of care.   Signed: Arnetha Courser, MD [facility] 07/13/2022

## 2022-07-17 DIAGNOSIS — D500 Iron deficiency anemia secondary to blood loss (chronic): Secondary | ICD-10-CM | POA: Diagnosis not present

## 2022-07-17 DIAGNOSIS — D510 Vitamin B12 deficiency anemia due to intrinsic factor deficiency: Secondary | ICD-10-CM | POA: Diagnosis not present

## 2022-07-17 DIAGNOSIS — K922 Gastrointestinal hemorrhage, unspecified: Secondary | ICD-10-CM | POA: Diagnosis not present

## 2022-07-24 DIAGNOSIS — M160 Bilateral primary osteoarthritis of hip: Secondary | ICD-10-CM | POA: Diagnosis not present

## 2022-08-07 DIAGNOSIS — D500 Iron deficiency anemia secondary to blood loss (chronic): Secondary | ICD-10-CM | POA: Diagnosis not present

## 2022-08-07 DIAGNOSIS — D510 Vitamin B12 deficiency anemia due to intrinsic factor deficiency: Secondary | ICD-10-CM | POA: Diagnosis not present

## 2022-08-07 DIAGNOSIS — K922 Gastrointestinal hemorrhage, unspecified: Secondary | ICD-10-CM | POA: Diagnosis not present

## 2022-08-14 DIAGNOSIS — D510 Vitamin B12 deficiency anemia due to intrinsic factor deficiency: Secondary | ICD-10-CM | POA: Diagnosis not present

## 2022-08-14 DIAGNOSIS — D500 Iron deficiency anemia secondary to blood loss (chronic): Secondary | ICD-10-CM | POA: Diagnosis not present

## 2022-08-14 DIAGNOSIS — K922 Gastrointestinal hemorrhage, unspecified: Secondary | ICD-10-CM | POA: Diagnosis not present

## 2022-08-15 DIAGNOSIS — H35373 Puckering of macula, bilateral: Secondary | ICD-10-CM | POA: Diagnosis not present

## 2022-08-22 DIAGNOSIS — C44319 Basal cell carcinoma of skin of other parts of face: Secondary | ICD-10-CM | POA: Diagnosis not present

## 2022-10-08 DIAGNOSIS — D500 Iron deficiency anemia secondary to blood loss (chronic): Secondary | ICD-10-CM | POA: Diagnosis not present

## 2022-10-15 DIAGNOSIS — D500 Iron deficiency anemia secondary to blood loss (chronic): Secondary | ICD-10-CM | POA: Diagnosis not present

## 2022-10-15 DIAGNOSIS — K922 Gastrointestinal hemorrhage, unspecified: Secondary | ICD-10-CM | POA: Diagnosis not present

## 2023-01-03 DIAGNOSIS — D2271 Melanocytic nevi of right lower limb, including hip: Secondary | ICD-10-CM | POA: Diagnosis not present

## 2023-01-03 DIAGNOSIS — Z85828 Personal history of other malignant neoplasm of skin: Secondary | ICD-10-CM | POA: Diagnosis not present

## 2023-01-03 DIAGNOSIS — L821 Other seborrheic keratosis: Secondary | ICD-10-CM | POA: Diagnosis not present

## 2023-01-03 DIAGNOSIS — D225 Melanocytic nevi of trunk: Secondary | ICD-10-CM | POA: Diagnosis not present

## 2023-01-03 DIAGNOSIS — D2262 Melanocytic nevi of left upper limb, including shoulder: Secondary | ICD-10-CM | POA: Diagnosis not present

## 2023-01-03 DIAGNOSIS — L570 Actinic keratosis: Secondary | ICD-10-CM | POA: Diagnosis not present

## 2023-01-03 DIAGNOSIS — D2261 Melanocytic nevi of right upper limb, including shoulder: Secondary | ICD-10-CM | POA: Diagnosis not present

## 2023-01-03 DIAGNOSIS — D2272 Melanocytic nevi of left lower limb, including hip: Secondary | ICD-10-CM | POA: Diagnosis not present

## 2023-02-03 DIAGNOSIS — Z125 Encounter for screening for malignant neoplasm of prostate: Secondary | ICD-10-CM | POA: Diagnosis not present

## 2023-02-03 DIAGNOSIS — E782 Mixed hyperlipidemia: Secondary | ICD-10-CM | POA: Diagnosis not present

## 2023-02-03 DIAGNOSIS — R7309 Other abnormal glucose: Secondary | ICD-10-CM | POA: Diagnosis not present

## 2023-02-10 DIAGNOSIS — Z0000 Encounter for general adult medical examination without abnormal findings: Secondary | ICD-10-CM | POA: Diagnosis not present

## 2023-02-10 DIAGNOSIS — E782 Mixed hyperlipidemia: Secondary | ICD-10-CM | POA: Diagnosis not present

## 2023-02-10 DIAGNOSIS — D500 Iron deficiency anemia secondary to blood loss (chronic): Secondary | ICD-10-CM | POA: Diagnosis not present

## 2023-02-10 DIAGNOSIS — D510 Vitamin B12 deficiency anemia due to intrinsic factor deficiency: Secondary | ICD-10-CM | POA: Diagnosis not present

## 2023-02-17 DIAGNOSIS — H35373 Puckering of macula, bilateral: Secondary | ICD-10-CM | POA: Diagnosis not present

## 2023-04-23 DIAGNOSIS — U071 COVID-19: Secondary | ICD-10-CM | POA: Diagnosis not present

## 2023-04-23 DIAGNOSIS — J208 Acute bronchitis due to other specified organisms: Secondary | ICD-10-CM | POA: Diagnosis not present

## 2023-06-27 DIAGNOSIS — M160 Bilateral primary osteoarthritis of hip: Secondary | ICD-10-CM | POA: Diagnosis not present

## 2023-07-09 DIAGNOSIS — M25551 Pain in right hip: Secondary | ICD-10-CM | POA: Diagnosis not present

## 2023-07-09 DIAGNOSIS — I2510 Atherosclerotic heart disease of native coronary artery without angina pectoris: Secondary | ICD-10-CM | POA: Diagnosis not present

## 2023-07-09 DIAGNOSIS — G8929 Other chronic pain: Secondary | ICD-10-CM | POA: Diagnosis not present

## 2023-08-04 DIAGNOSIS — D510 Vitamin B12 deficiency anemia due to intrinsic factor deficiency: Secondary | ICD-10-CM | POA: Diagnosis not present

## 2023-08-04 DIAGNOSIS — E782 Mixed hyperlipidemia: Secondary | ICD-10-CM | POA: Diagnosis not present

## 2023-08-04 DIAGNOSIS — D500 Iron deficiency anemia secondary to blood loss (chronic): Secondary | ICD-10-CM | POA: Diagnosis not present

## 2023-08-11 DIAGNOSIS — E782 Mixed hyperlipidemia: Secondary | ICD-10-CM | POA: Diagnosis not present

## 2023-08-11 DIAGNOSIS — D510 Vitamin B12 deficiency anemia due to intrinsic factor deficiency: Secondary | ICD-10-CM | POA: Diagnosis not present

## 2023-08-11 DIAGNOSIS — Z125 Encounter for screening for malignant neoplasm of prostate: Secondary | ICD-10-CM | POA: Diagnosis not present

## 2023-08-11 DIAGNOSIS — R739 Hyperglycemia, unspecified: Secondary | ICD-10-CM | POA: Diagnosis not present

## 2023-08-11 DIAGNOSIS — D500 Iron deficiency anemia secondary to blood loss (chronic): Secondary | ICD-10-CM | POA: Diagnosis not present

## 2023-08-11 DIAGNOSIS — I2510 Atherosclerotic heart disease of native coronary artery without angina pectoris: Secondary | ICD-10-CM | POA: Diagnosis not present

## 2023-08-12 NOTE — H&P (Signed)
 TOTAL HIP ADMISSION H&P  Patient is admitted for right total hip arthroplasty.  Subjective:  Chief Complaint: Right hip pain  HPI: Fred Perkins, 78 y.o. male, has a history of pain and functional disability in the right hip due to arthritis and patient has failed non-surgical conservative treatments for greater than 12 weeks to include NSAID's and/or analgesics, use of assistive devices, and activity modification. Onset of symptoms was gradual, starting  several  years ago with gradually worsening course since that time. The patient noted no past surgery on the right hip. Patient currently rates pain in the right hip at 8 out of 10 with activity. Patient has night pain, worsening of pain with activity and weight bearing, and trendelenberg gait. Patient has evidence of  severe end-stage arthritis in both hips, with bone-on-bone contact and flattening of the femoral heads, worse on the right than the left. There are large osteophytes in both hips, and the right leg is approximately three-eighths of an inch shorter than the left due to erosion of the femoral head  by imaging studies. This condition presents safety issues increasing the risk of falls. There is no current active infection.  Patient Active Problem List   Diagnosis Date Noted   Anemia 07/13/2022   Melena 07/13/2022   GI bleeding 07/11/2022   BPH (benign prostatic hyperplasia) 07/11/2022   Arthritis 07/11/2022   Acute blood loss anemia 07/11/2022   Syncope and collapse 07/11/2022   HLD (hyperlipidemia) 07/11/2022   CAD (coronary artery disease) 07/11/2022    Past Medical History:  Diagnosis Date   Arthritis    BPH (benign prostatic hyperplasia)    H/O heart artery stent    Heart disease    High cholesterol    Skin cancer, basal cell    Squamous cell skin cancer     Past Surgical History:  Procedure Laterality Date   BACK SURGERY  1992   L4, 5   CATARACT EXTRACTION, BILATERAL  2014   ESOPHAGOGASTRODUODENOSCOPY (EGD)  WITH PROPOFOL  N/A 07/11/2022   Procedure: ESOPHAGOGASTRODUODENOSCOPY (EGD) WITH PROPOFOL ;  Surgeon: Quintin Buckle, DO;  Location: [facility] ENDOSCOPY;  Service: Gastroenterology;  Laterality: N/A;   macular surgery Right    MOHS SURGERY      Prior to Admission medications   Medication Sig Start Date End Date Taking? Authorizing Provider  cyanocobalamin  1000 MCG tablet Take 1 tablet (1,000 mcg total) by mouth daily. 07/13/22   Luna Salinas, MD  feeding supplement (ENSURE ENLIVE / ENSURE PLUS) LIQD Take 237 mLs by mouth 2 (two) times daily between meals. 07/13/22   Luna Salinas, MD  folic acid  (FOLVITE ) 1 MG tablet Take 1 tablet (1 mg total) by mouth daily. 07/13/22   Amin, Sumayya, MD  Multiple Vitamin (MULTIVITAMIN WITH MINERALS) TABS tablet Take 1 tablet by mouth daily. 07/13/22   Luna Salinas, MD  omeprazole  (PRILOSEC) 40 MG capsule Take 1 capsule (40 mg total) by mouth in the morning and at bedtime. 07/13/22 09/11/22  Amin, Sumayya, MD  simvastatin  (ZOCOR ) 40 MG tablet Take 40 mg by mouth daily. 12/11/17   [provider]  tamsulosin  (FLOMAX ) 0.4 MG CAPS capsule Take 0.4 mg by mouth daily. 09/15/17   [provider]  traMADol  (ULTRAM ) 50 MG tablet Take 1 tablet (50 mg total) by mouth every 6 (six) hours as needed for severe pain. 07/13/22   Luna Salinas, MD    Allergies  Allergen Reactions   Diclofenac Nausea And Vomiting   Meloxicam Nausea And Vomiting  Sudafed [Pseudoephedrine] Other (See Comments)    Social History   Socioeconomic History   Marital status: Married    Spouse name: Not on file   Number of children: 0   Years of education: Not on file   Highest education level: Not on file  Occupational History   Not on file  Tobacco Use   Smoking status: Never   Smokeless tobacco: Never  Vaping Use   Vaping status: Never Used  Substance and Sexual Activity   Alcohol use: Not Currently   Drug use: Not Currently   Sexual activity: Not on file  Other Topics Concern    Not on file  Social History Narrative   Not on file   Social Drivers of Health   Financial Resource Strain: Low Risk  (07/03/2022)   [facility] from [facility]   Overall Financial Resource Strain (CARDIA)    Difficulty of Paying Living Expenses: Not hard at all  Food Insecurity: No Food Insecurity (07/11/2022)   Hunger Vital Sign    Worried About Running Out of Food in the Last Year: Never true    Ran Out of Food in the Last Year: Never true  Transportation Needs: No Transportation Needs (07/11/2022)   PRAPARE - Administrator, Civil Service (Medical): No    Lack of Transportation (Non-Medical): No  Physical Activity: Not on file  Stress: Not on file  Social Connections: Not on file  Intimate Partner Violence: Not At Risk (07/11/2022)   Humiliation, Afraid, Rape, and Kick questionnaire    Fear of Current or Ex-Partner: No    Emotionally Abused: No    Physically Abused: No    Sexually Abused: No    Tobacco Use: Low Risk  (08/11/2023)   Received from [facility]   Patient History    Smoking Tobacco Use: Never    Smokeless Tobacco Use: Never    Passive Exposure: Past   Social History   Substance and Sexual Activity  Alcohol Use Not Currently    Family History  Problem Relation Age of Onset   Dementia Father    Alzheimer's disease Mother     ROS   Objective:  Physical Exam: Well nourished and well developed.  General: Alert and oriented x3, cooperative and pleasant, no acute distress.  Head: normocephalic, atraumatic, neck supple.  Eyes: EOMI. Abdomen: non-tender to palpation and soft, normoactive bowel sounds. Musculoskeletal: - Right hip: Flexion to 100 degrees, no internal rotation, approximately 10 degrees of external rotation, 20 degrees of abduction.  - Left hip: Flexion to 100 degrees, approximately 5 degrees of internal rotation, 15 degrees of external rotation, 20 degrees of  abduction.  - Knee exam: Normal bilaterally.  - Gait pattern: Significant antalgic gait on the right. Calves soft and nontender. Motor function intact in LE. Strength 5/5 LE bilaterally. Neuro: Distal pulses 2+. Sensation to light touch intact in LE.  Vital signs in last 24 hours: BP: ()/()  Arterial Line BP: ()/()   Imaging Review Plain radiographs demonstrate severe degenerative joint disease of the right hip. The bone quality appears to be adequate for age and reported activity level.  Assessment/Plan:  End stage arthritis, right hip  The patient history, physical examination, clinical judgement of the provider and imaging studies are consistent with end stage degenerative joint disease of the right hip and total hip arthroplasty is deemed medically necessary. The treatment options including medical management, injection therapy, arthroscopy and arthroplasty were  discussed at length. The risks and benefits of total hip arthroplasty were presented and reviewed. The risks due to aseptic loosening, infection, stiffness, dislocation/subluxation, thromboembolic complications and other imponderables were discussed. The patient acknowledged the explanation, agreed to proceed with the plan and consent was signed. Patient is being admitted for inpatient treatment for surgery, pain control, PT, OT, prophylactic antibiotics, VTE prophylaxis, progressive ambulation and ADLs and discharge planning.The patient is planning to be discharged  home .   Therapy Plans: HEP Disposition: Home with Wife Planned DVT Prophylaxis: Xarelto 10 mg (intolerant to aspirin) DME Needed: RW PCP: Firman Hughes, MD (EPIC note 07/14/2023) TXA: IV Allergies: NSAIDs (GI bleed), pseudoephedrine Anesthesia Concerns: flushing and near syncopal episode with colonoscopy BMI: 23.2 Last HgbA1c: not diabetic  Pharmacy: [facility] (deliver to room)  Other: -Hx CAD and stent (2002) -Currently takes tramadol  50mg  QID -Did not  tolerate hydrocodone due to heat/flushing - discussed trying oxycodone  post-op  - Patient was instructed on what medications to stop prior to surgery. - Follow-up visit in 2 weeks with Dr. France Ina - Begin physical therapy following surgery - Pre-operative lab work as pre-surgical testing - Prescriptions will be provided in hospital at time of discharge  R. Brinton Canavan, PA-C Orthopedic Surgery [facility]

## 2023-08-14 DIAGNOSIS — H35373 Puckering of macula, bilateral: Secondary | ICD-10-CM | POA: Diagnosis not present

## 2023-08-27 NOTE — Progress Notes (Addendum)
 COVID Vaccine Completed:  Date of COVID positive in last 90 days:  PCP - Firman Hughes, MD Cardiologist - has not seen in 20 years, has stenet  Medical clearance by Dr. Annabell Key  08/11/23 in Epic  Chest x-ray - n/a EKG - 08/28/23 Epic/chart Stress Test - 20+ years ago per pt ECHO - n/a Cardiac Cath - 2002 Pacemaker/ICD device last checked: n/a Spinal Cord Stimulator:n/a  Bowel Prep - no  Sleep Study - n/a CPAP -   Fasting Blood Sugar - n/a Checks Blood Sugar _____ times a day  Last dose of GLP1 agonist-  N/A GLP1 instructions:  Hold 7 days before surgery    Last dose of SGLT-2 inhibitors-  N/A SGLT-2 instructions:  Hold 3 days before surgery    Blood Thinner Instructions:  Last dose: n/a  Time: Aspirin Instructions: Last Dose:  Activity level: Can go up a flight of stairs and perform activities of daily living without stopping and without symptoms of chest pain or shortness of breath. Ambulates with cane.   Anesthesia review: CAD x1 stent, anemia, RBBB  Patient denies shortness of breath, fever, cough and chest pain at PAT appointment  Patient verbalized understanding of instructions that were given to them at the PAT appointment. Patient was also instructed that they will need to review over the PAT instructions again at home before surgery.

## 2023-08-28 ENCOUNTER — Encounter (HOSPITAL_COMMUNITY)
Admission: RE | Admit: 2023-08-28 | Discharge: 2023-08-28 | Disposition: A | Discharge status: Home / Self Care | Source: Ambulatory Visit | Attending: Orthopedic Surgery | Admitting: Orthopedic Surgery

## 2023-08-28 ENCOUNTER — Encounter (HOSPITAL_COMMUNITY): Payer: Self-pay

## 2023-08-28 ENCOUNTER — Other Ambulatory Visit: Payer: Self-pay

## 2023-08-28 VITALS — BP 130/82 | HR 67 | Temp 98.1°F | Resp 14 | Ht 67.0 in | Wt 158.0 lb

## 2023-08-28 DIAGNOSIS — D649 Anemia, unspecified: Secondary | ICD-10-CM | POA: Diagnosis not present

## 2023-08-28 DIAGNOSIS — Z01818 Encounter for other preprocedural examination: Secondary | ICD-10-CM | POA: Insufficient documentation

## 2023-08-28 DIAGNOSIS — I2510 Atherosclerotic heart disease of native coronary artery without angina pectoris: Secondary | ICD-10-CM | POA: Diagnosis not present

## 2023-08-28 DIAGNOSIS — I4510 Unspecified right bundle-branch block: Secondary | ICD-10-CM | POA: Insufficient documentation

## 2023-08-28 DIAGNOSIS — M1990 Unspecified osteoarthritis, unspecified site: Secondary | ICD-10-CM | POA: Insufficient documentation

## 2023-08-28 DIAGNOSIS — M1611 Unilateral primary osteoarthritis, right hip: Secondary | ICD-10-CM | POA: Insufficient documentation

## 2023-08-28 HISTORY — DX: Atherosclerotic heart disease of native coronary artery without angina pectoris: I25.10

## 2023-08-28 HISTORY — DX: Unspecified right bundle-branch block: I45.10

## 2023-08-28 LAB — TYPE AND SCREEN
ABO/RH(D): A POS
Antibody Screen: NEGATIVE

## 2023-08-28 LAB — BASIC METABOLIC PANEL WITH GFR
Anion gap: 7 (ref 5–15)
BUN: 14 mg/dL (ref 8–23)
CO2: 27 mmol/L (ref 22–32)
Calcium: 9.1 mg/dL (ref 8.9–10.3)
Chloride: 102 mmol/L (ref 98–111)
Creatinine, Ser: 1.07 mg/dL (ref 0.61–1.24)
GFR, Estimated: >60 mL/min (ref >60)
Glucose, Bld: 87 mg/dL (ref 70–99)
Potassium: 5.2 mmol/L — ABNORMAL HIGH (ref 3.5–5.1)
Sodium: 136 mmol/L (ref 135–145)

## 2023-08-28 LAB — CBC
HCT: 39 % (ref 39.0–52.0)
Hemoglobin: 12.6 g/dL — ABNORMAL LOW (ref 13.0–17.0)
MCH: 30 pg (ref 26.0–34.0)
MCHC: 32.3 g/dL (ref 30.0–36.0)
MCV: 92.9 fL (ref 80.0–100.0)
Platelets: 292 10*3/uL (ref 150–400)
RBC: 4.2 MIL/uL — ABNORMAL LOW (ref 4.22–5.81)
RDW: 14.3 % (ref 11.5–15.5)
WBC: 7.7 10*3/uL (ref 4.0–10.5)
nRBC: 0 % (ref 0.0–0.2)

## 2023-08-28 LAB — SURGICAL PCR SCREEN
MRSA, PCR: NEGATIVE
Staphylococcus aureus: NEGATIVE

## 2023-08-28 NOTE — Patient Instructions (Addendum)
 SURGICAL WAITING ROOM VISITATION  Patients having surgery or a procedure may have no more than 2 support people in the waiting area - these visitors may rotate.    Children under the age of 55 must have an adult with them who is not the patient.  Due to an increase in RSV and influenza rates and associated hospitalizations, children ages 63 and under may not visit patients in [facility] hospitals.  Visitors with respiratory illnesses are discouraged from visiting and should remain at home.  If the patient needs to stay at the hospital during part of their recovery, the visitor guidelines for inpatient rooms apply. Pre-op nurse will coordinate an appropriate time for 1 support person to accompany patient in pre-op.  This support person may not rotate.    Please refer to the [facility] website for the visitor guidelines for Inpatients (after your surgery is over and you are in a regular room).    Your procedure is scheduled on: 09/10/23   Report to [facility] Main Entrance    Report to admitting at 10:15 AM   Call this number if you have problems the morning of surgery [phone]   Do not eat food :After Midnight.   After Midnight you may have the following liquids until 9:45 AM DAY OF SURGERY  Water Non-Citrus Juices (without pulp, NO RED-Apple, White grape, White cranberry) Black Coffee (NO MILK/CREAM OR CREAMERS, sugar ok)  Clear Tea (NO MILK/CREAM OR CREAMERS, sugar ok) regular and decaf                             Plain Jell-O (NO RED)                                           Fruit ices (not with fruit pulp, NO RED)                                     Popsicles (NO RED)                                                               Sports drinks like Gatorade (NO RED)                 The day of surgery:  Drink ONE (1) Pre-Surgery Clear Ensure at 9:45 AM the morning of surgery. Drink in one sitting. Do not sip.  This drink was given to you during your hospital   pre-op appointment visit. Nothing else to drink after completing the  Pre-Surgery Clear Ensure.          If you have questions, please contact your surgeon's office.   FOLLOW BOWEL PREP AND ANY ADDITIONAL PRE OP INSTRUCTIONS YOU RECEIVED FROM YOUR SURGEON'S OFFICE!!!     Oral Hygiene is also important to reduce your risk of infection.                                    Remember -  BRUSH YOUR TEETH THE MORNING OF SURGERY WITH YOUR REGULAR TOOTHPASTE  DENTURES WILL BE REMOVED PRIOR TO SURGERY PLEASE DO NOT APPLY "Poly grip" OR ADHESIVES!!!   Stop all vitamins and herbal supplements 7 days before surgery.   Take these medicines the morning of surgery with A SIP OF WATER: Tylenol , Omeprazole , Tamsulosin , Tramadol                                You may not have any metal on your body including jewelry, and body piercing             Do not wear lotions, powders, cologne, or deodorant              Men may shave face and neck.   Do not bring valuables to the hospital. [facility] IS NOT             RESPONSIBLE   FOR VALUABLES.   Contacts, glasses, dentures or bridgework may not be worn into surgery.   Bring small overnight bag day of surgery.   DO NOT BRING YOUR HOME MEDICATIONS TO THE HOSPITAL. PHARMACY WILL DISPENSE MEDICATIONS LISTED ON YOUR MEDICATION LIST TO YOU DURING YOUR ADMISSION IN THE HOSPITAL!              Please read over the following fact sheets you were given: IF YOU HAVE QUESTIONS ABOUT YOUR PRE-OP INSTRUCTIONS PLEASE CALL [name]   If you received a COVID test during your pre-op visit  it is requested that you wear a mask when out in public, stay away from anyone that may not be feeling well and notify your surgeon if you develop symptoms. If you test positive for Covid or have been in contact with anyone that has tested positive in the last 10 days please notify you surgeon.      Pre-operative 5 CHG Bath Instructions   You can play a key role in  reducing the risk of infection after surgery. Your skin needs to be as free of germs as possible. You can reduce the number of germs on your skin by washing with CHG (chlorhexidine gluconate) soap before surgery. CHG is an antiseptic soap that kills germs and continues to kill germs even after washing.   DO NOT use if you have an allergy to chlorhexidine/CHG or antibacterial soaps. If your skin becomes reddened or irritated, stop using the CHG and notify one of our RNs at [phone].   Please shower with the CHG soap starting 4 days before surgery using the following schedule:     Please keep in mind the following:  DO NOT shave, including legs and underarms, starting the day of your first shower.   You may shave your face at any point before/day of surgery.  Place clean sheets on your bed the day you start using CHG soap. Use a clean washcloth (not used since being washed) for each shower. DO NOT sleep with pets once you start using the CHG.   CHG Shower Instructions:  If you choose to wash your hair and private area, wash first with your normal shampoo/soap.  After you use shampoo/soap, rinse your hair and body thoroughly to remove shampoo/soap residue.  Turn the water OFF and apply about 3 tablespoons (45 ml) of CHG soap to a CLEAN washcloth.  Apply CHG soap ONLY FROM YOUR NECK DOWN TO YOUR TOES (washing for 3-5 minutes)  DO NOT use CHG  soap on face, private areas, open wounds, or sores.  Pay special attention to the area where your surgery is being performed.  If you are having back surgery, having someone wash your back for you may be helpful. Wait 2 minutes after CHG soap is applied, then you may rinse off the CHG soap.  Pat dry with a clean towel  Put on clean clothes/pajamas   If you choose to wear lotion, please use ONLY the CHG-compatible lotions on the back of this paper.     Additional instructions for the day of surgery: DO NOT APPLY any lotions, deodorants, cologne, or  perfumes.   Put on clean/comfortable clothes.  Brush your teeth.  Ask your nurse before applying any prescription medications to the skin.      CHG Compatible Lotions   Aveeno Moisturizing lotion  Cetaphil Moisturizing Cream  Cetaphil Moisturizing Lotion  Clairol Herbal Essence Moisturizing Lotion, Dry Skin  Clairol Herbal Essence Moisturizing Lotion, Extra Dry Skin  Clairol Herbal Essence Moisturizing Lotion, Normal Skin  Curel Age Defying Therapeutic Moisturizing Lotion with Alpha Hydroxy  Curel Extreme Care Body Lotion  Curel Soothing Hands Moisturizing Hand Lotion  Curel Therapeutic Moisturizing Cream, Fragrance-Free  Curel Therapeutic Moisturizing Lotion, Fragrance-Free  Curel Therapeutic Moisturizing Lotion, Original Formula  Eucerin Daily Replenishing Lotion  Eucerin Dry Skin Therapy Plus Alpha Hydroxy Crme  Eucerin Dry Skin Therapy Plus Alpha Hydroxy Lotion  Eucerin Original Crme  Eucerin Original Lotion  Eucerin Plus Crme Eucerin Plus Lotion  Eucerin TriLipid Replenishing Lotion  Keri Anti-Bacterial Hand Lotion  Keri Deep Conditioning Original Lotion Dry Skin Formula Softly Scented  Keri Deep Conditioning Original Lotion, Fragrance Free Sensitive Skin Formula  Keri Lotion Fast Absorbing Fragrance Free Sensitive Skin Formula  Keri Lotion Fast Absorbing Softly Scented Dry Skin Formula  Keri Original Lotion  Keri Skin Renewal Lotion Keri Silky Smooth Lotion  Keri Silky Smooth Sensitive Skin Lotion  Nivea Body Creamy Conditioning Oil  Nivea Body Extra Enriched Teacher, adult education Moisturizing Lotion Nivea Crme  Nivea Skin Firming Lotion  NutraDerm 30 Skin Lotion  NutraDerm Skin Lotion  NutraDerm Therapeutic Skin Cream  NutraDerm Therapeutic Skin Lotion  ProShield Protective Hand Cream  Provon moisturizing lotion  WHAT IS A BLOOD TRANSFUSION? Blood Transfusion Information  A transfusion is the replacement of blood or some of  its parts. Blood is made up of multiple cells which provide different functions. Red blood cells carry oxygen and are used for blood loss replacement. White blood cells fight against infection. Platelets control bleeding. Plasma helps clot blood. Other blood products are available for specialized needs, such as hemophilia or other clotting disorders. BEFORE THE TRANSFUSION  Who gives blood for transfusions?  Healthy volunteers who are fully evaluated to make sure their blood is safe. This is blood bank blood. Transfusion therapy is the safest it has ever been in the practice of medicine. Before blood is taken from a donor, a complete history is taken to make sure that person has no history of diseases nor engages in risky social behavior (examples are intravenous drug use or sexual activity with multiple partners). The donor's travel history is screened to minimize risk of transmitting infections, such as malaria. The donated blood is tested for signs of infectious diseases, such as HIV and hepatitis. The blood is then tested to be sure it is compatible with you in order to minimize the chance of a transfusion reaction. If you or a relative donates  blood, this is often done in anticipation of surgery and is not appropriate for emergency situations. It takes many days to process the donated blood. RISKS AND COMPLICATIONS Although transfusion therapy is very safe and saves many lives, the main dangers of transfusion include:  Getting an infectious disease. Developing a transfusion reaction. This is an allergic reaction to something in the blood you were given. Every precaution is taken to prevent this. The decision to have a blood transfusion has been considered carefully by your caregiver before blood is given. Blood is not given unless the benefits outweigh the risks. AFTER THE TRANSFUSION Right after receiving a blood transfusion, you will usually feel much better and more energetic. This is  especially true if your red blood cells have gotten low (anemic). The transfusion raises the level of the red blood cells which carry oxygen, and this usually causes an energy increase. The nurse administering the transfusion will monitor you carefully for complications. HOME CARE INSTRUCTIONS  No special instructions are needed after a transfusion. You may find your energy is better. Speak with your caregiver about any limitations on activity for underlying diseases you may have. SEEK MEDICAL CARE IF:  Your condition is not improving after your transfusion. You develop redness or irritation at the intravenous (IV) site. SEEK IMMEDIATE MEDICAL CARE IF:  Any of the following symptoms occur over the next 12 hours: Shaking chills. You have a temperature by mouth above 102 F (38.9 C), not controlled by medicine. Chest, back, or muscle pain. People around you feel you are not acting correctly or are confused. Shortness of breath or difficulty breathing. Dizziness and fainting. You get a rash or develop hives. You have a decrease in urine output. Your urine turns a dark color or changes to pink, red, or brown. Any of the following symptoms occur over the next 10 days: You have a temperature by mouth above 102 F (38.9 C), not controlled by medicine. Shortness of breath. Weakness after normal activity. The white part of the eye turns yellow (jaundice). You have a decrease in the amount of urine or are urinating less often. Your urine turns a dark color or changes to pink, red, or brown. Document Released: 03/22/2000 Document Revised: 06/17/2011 Document Reviewed: 11/09/2007 Exit[employer] Patient Information 2014 [place], [place].  _______________________________________________________________________  Incentive Spirometer  An incentive spirometer is a tool that can help keep your lungs clear and active. This tool measures how well you are filling your lungs with each breath. Taking long deep  breaths may help reverse or decrease the chance of developing breathing (pulmonary) problems (especially infection) following: A long period of time when you are unable to move or be active. BEFORE THE PROCEDURE  If the spirometer includes an indicator to show your best effort, your nurse or respiratory therapist will set it to a desired goal. If possible, sit up straight or lean slightly forward. Try not to slouch. Hold the incentive spirometer in an upright position. INSTRUCTIONS FOR USE  Sit on the edge of your bed if possible, or sit up as far as you can in bed or on a chair. Hold the incentive spirometer in an upright position. Breathe out normally. Place the mouthpiece in your mouth and seal your lips tightly around it. Breathe in slowly and as deeply as possible, raising the piston or the ball toward the top of the column. Hold your breath for 3-5 seconds or for as long as possible. Allow the piston or ball to fall to the  bottom of the column. Remove the mouthpiece from your mouth and breathe out normally. Rest for a few seconds and repeat Steps 1 through 7 at least 10 times every 1-2 hours when you are awake. Take your time and take a few normal breaths between deep breaths. The spirometer may include an indicator to show your best effort. Use the indicator as a goal to work toward during each repetition. After each set of 10 deep breaths, practice coughing to be sure your lungs are clear. If you have an incision (the cut made at the time of surgery), support your incision when coughing by placing a pillow or rolled up towels firmly against it. Once you are able to get out of bed, walk around indoors and cough well. You may stop using the incentive spirometer when instructed by your caregiver.  RISKS AND COMPLICATIONS Take your time so you do not get dizzy or light-headed. If you are in pain, you may need to take or ask for pain medication before doing incentive spirometry. It is harder  to take a deep breath if you are having pain. AFTER USE Rest and breathe slowly and easily. It can be helpful to keep track of a log of your progress. Your caregiver can provide you with a simple table to help with this. If you are using the spirometer at home, follow these instructions: SEEK MEDICAL CARE IF:  You are having difficultly using the spirometer. You have trouble using the spirometer as often as instructed. Your pain medication is not giving enough relief while using the spirometer. You develop fever of 100.5 F (38.1 C) or higher. SEEK IMMEDIATE MEDICAL CARE IF:  You cough up bloody sputum that had not been present before. You develop fever of 102 F (38.9 C) or greater. You develop worsening pain at or near the incision site. MAKE SURE YOU:  Understand these instructions. Will watch your condition. Will get help right away if you are not doing well or get worse. Document Released: 08/05/2006 Document Revised: 06/17/2011 Document Reviewed: 10/06/2006 [facility] Patient Information 2014 [place], [place].   ________________________________________________________________________

## 2023-09-02 ENCOUNTER — Encounter (HOSPITAL_COMMUNITY): Payer: Self-pay

## 2023-09-02 NOTE — Progress Notes (Signed)
 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 PMH of CAD s/p PCI, RBBB, PUD, arthritis, anemia.  Prior anesthesia complication includes syncope with IV placement  Patient has hx of CAD s/p PCI in 2002. No longer follows with Cardiology. Pt follows regularly with PCP. Last seen on 08/11/23. No acute issues. Cleared for surgery by Dr. Annabell Key: "Severe right hip arthritis-upcoming surgery, low surgical risk"    VS: BP 130/82   Pulse 67   Temp 36.7 C (Oral)   Resp 14   Ht 5\' 7"  (1.702 m)   Wt 71.7 kg   SpO2 98%   BMI 24.75 kg/m   PROVIDERS: Sari Cunning, MD   LABS: Labs reviewed: Acceptable for surgery. (all labs ordered are listed, but only abnormal results are displayed)  Labs Reviewed  CBC - Abnormal; Notable for the following components:      Result Value   RBC 4.20 (*)    Hemoglobin 12.6 (*)    All other components within normal limits  BASIC METABOLIC PANEL WITH GFR - Abnormal; Notable for the following components:   Potassium 5.2 (*)    All other components within normal limits  SURGICAL PCR SCREEN  TYPE AND SCREEN     IMAGES:   EKG 08/28/23  Normal sinus rhythm, rate 69 Right bundle branch block  CV:  Past Medical History:  Diagnosis Date   Arthritis    BPH (benign prostatic hyperplasia)    Coronary artery disease    H/O heart artery stent    Heart disease    High cholesterol    RBBB    Skin cancer, basal cell    Squamous cell skin cancer     Past Surgical History:  Procedure Laterality Date   BACK SURGERY  1992   L4, 5   CARDIAC CATHETERIZATION     stent x1   CATARACT EXTRACTION, BILATERAL   2014   COLONOSCOPY     ESOPHAGOGASTRODUODENOSCOPY (EGD) WITH PROPOFOL  N/A 07/11/2022   Procedure: ESOPHAGOGASTRODUODENOSCOPY (EGD) WITH PROPOFOL ;  Surgeon: Quintin Buckle, DO;  Location: [facility] ENDOSCOPY;  Service: Gastroenterology;  Laterality: N/A;   macular surgery Right    MOHS SURGERY      MEDICATIONS:  acetaminophen  (TYLENOL ) 325 MG tablet   Cyanocobalamin  (B-12) 1000 MCG SUBL   folic acid  (FOLVITE ) 1 MG tablet   Multiple Vitamin (MULTIVITAMIN WITH MINERALS) TABS tablet   omeprazole  (PRILOSEC) 40 MG capsule   simvastatin  (ZOCOR ) 40 MG tablet   tamsulosin  (FLOMAX ) 0.4 MG CAPS capsule   traMADol  (ULTRAM ) 50 MG tablet   No current facility-administered medications for this encounter.   Antoinette Kirschner [facility] Surgical Short Stay/Anesthesiology [facility] Phone [phone] 09/02/2023 3:47 PM

## 2023-09-02 NOTE — Anesthesia Preprocedure Evaluation (Addendum)
 Anesthesia Evaluation  Patient identified by MRN, date of birth, ID band Patient awake    Reviewed: Allergy & Precautions, NPO status , Patient's Chart, lab work & pertinent test results  History of Anesthesia Complications Negative for: history of anesthetic complications  Airway Mallampati: II  TM Distance: >3 FB Neck ROM: Full    Dental no notable dental hx. (+) Teeth Intact, Dental Advisory Given   Pulmonary    Pulmonary exam normal breath sounds clear to auscultation       Cardiovascular + CAD and + Cardiac Stents (2002)  Normal cardiovascular exam+ dysrhythmias (RBBB)  Rhythm:Regular Rate:Normal     Neuro/Psych L4-5 Back surgery  negative psych ROS   GI/Hepatic Neg liver ROS,GERD  Medicated and Controlled,,  Endo/Other    Renal/GU Lab Results      Component                Value               Date                           K                        5.2 (H)             08/28/2023                CO2                      27                  08/28/2023                BUN                      14                  08/28/2023                CREATININE               1.07                08/28/2023                     Musculoskeletal  (+) Arthritis ,  L4-5 Back surgery   Abdominal   Peds  Hematology Lab Results      Component                Value               Date                      WBC                      7.7                 08/28/2023                HGB                      12.6 (L)            08/28/2023  HCT                      39.0                08/28/2023                MCV                      92.9                08/28/2023                PLT                      292                 08/28/2023              Anesthesia Other Findings All: NSAIDs Diclofenac, meloxicam, sudafed  Reproductive/Obstetrics                             Anesthesia Physical Anesthesia  Plan  ASA: 3  Anesthesia Plan: General   Post-op Pain Management: Precedex and Ofirmev  IV (intra-op)*   Induction: Intravenous  PONV Risk Score and Plan: 3 and Treatment may vary due to age or medical condition, Ondansetron  and Dexamethasone  Airway Management Planned: Oral ETT  Additional Equipment: None  Intra-op Plan:   Post-operative Plan: Extubation in OR  Informed Consent: I have reviewed the patients History and Physical, chart, labs and discussed the procedure including the risks, benefits and alternatives for the proposed anesthesia with the patient or authorized representative who has indicated his/her understanding and acceptance.     Dental advisory given  Plan Discussed with: CRNA and Surgeon  Anesthesia Plan Comments: (See PAT note from 5/22)        Anesthesia Quick Evaluation

## 2023-09-10 ENCOUNTER — Other Ambulatory Visit: Payer: Self-pay

## 2023-09-10 ENCOUNTER — Ambulatory Visit (HOSPITAL_COMMUNITY): Payer: Self-pay | Admitting: Anesthesiology

## 2023-09-10 ENCOUNTER — Observation Stay (HOSPITAL_COMMUNITY)
Admission: RE | Admit: 2023-09-10 | Discharge: 2023-09-11 | Disposition: A | Discharge status: Home / Self Care | Source: Ambulatory Visit | Attending: Orthopedic Surgery | Admitting: Orthopedic Surgery

## 2023-09-10 ENCOUNTER — Observation Stay (HOSPITAL_COMMUNITY)

## 2023-09-10 ENCOUNTER — Encounter (HOSPITAL_COMMUNITY): Payer: Self-pay | Admitting: Orthopedic Surgery

## 2023-09-10 ENCOUNTER — Ambulatory Visit (HOSPITAL_COMMUNITY): Payer: Self-pay | Admitting: Physician Assistant

## 2023-09-10 ENCOUNTER — Ambulatory Visit (HOSPITAL_COMMUNITY)

## 2023-09-10 ENCOUNTER — Encounter (HOSPITAL_COMMUNITY)
Admission: RE | Disposition: A | Discharge status: Home / Self Care | Payer: Self-pay | Source: Ambulatory Visit | Attending: Orthopedic Surgery

## 2023-09-10 DIAGNOSIS — Z85828 Personal history of other malignant neoplasm of skin: Secondary | ICD-10-CM | POA: Insufficient documentation

## 2023-09-10 DIAGNOSIS — Z79899 Other long term (current) drug therapy: Secondary | ICD-10-CM | POA: Diagnosis not present

## 2023-09-10 DIAGNOSIS — I2510 Atherosclerotic heart disease of native coronary artery without angina pectoris: Secondary | ICD-10-CM | POA: Insufficient documentation

## 2023-09-10 DIAGNOSIS — E785 Hyperlipidemia, unspecified: Secondary | ICD-10-CM

## 2023-09-10 DIAGNOSIS — M1611 Unilateral primary osteoarthritis, right hip: Secondary | ICD-10-CM

## 2023-09-10 DIAGNOSIS — Z955 Presence of coronary angioplasty implant and graft: Secondary | ICD-10-CM | POA: Insufficient documentation

## 2023-09-10 DIAGNOSIS — Z471 Aftercare following joint replacement surgery: Secondary | ICD-10-CM | POA: Diagnosis not present

## 2023-09-10 DIAGNOSIS — Z96641 Presence of right artificial hip joint: Secondary | ICD-10-CM | POA: Diagnosis not present

## 2023-09-10 HISTORY — PX: TOTAL HIP ARTHROPLASTY: SHX124

## 2023-09-10 SURGERY — ARTHROPLASTY, HIP, TOTAL, ANTERIOR APPROACH
Anesthesia: General | Site: Hip | Laterality: Right

## 2023-09-10 MED ORDER — PHENYLEPHRINE HCL-NACL 20-0.9 MG/250ML-% IV SOLN
INTRAVENOUS | Status: DC | PRN
  Administered 2023-09-10: 20 ug/min via INTRAVENOUS

## 2023-09-10 MED ORDER — ORAL CARE MOUTH RINSE: 15.0000 mL | Freq: Once | OROMUCOSAL | Status: AC

## 2023-09-10 MED ORDER — METHOCARBAMOL 500 MG PO TABS
500.0000 mg | ORAL_TABLET | Freq: Four times a day (QID) | ORAL | Status: DC | PRN
  Administered 2023-09-10 – 2023-09-11 (×2): 500 mg via ORAL
  Filled 2023-09-10 (×2): qty 1

## 2023-09-10 MED ORDER — ONDANSETRON HCL 4 MG/2ML IJ SOLN
INTRAMUSCULAR | Status: AC
  Filled 2023-09-10: qty 2

## 2023-09-10 MED ORDER — EPHEDRINE SULFATE-NACL 50-0.9 MG/10ML-% IV SOSY
PREFILLED_SYRINGE | INTRAVENOUS | Status: DC | PRN
  Administered 2023-09-10 (×3): 5 mg via INTRAVENOUS

## 2023-09-10 MED ORDER — TRAMADOL HCL 50 MG PO TABS
50.0000 mg | ORAL_TABLET | Freq: Four times a day (QID) | ORAL | Status: DC | PRN
  Administered 2023-09-10: 50 mg via ORAL
  Filled 2023-09-10: qty 1

## 2023-09-10 MED ORDER — TAMSULOSIN HCL 0.4 MG PO CAPS
0.4000 mg | ORAL_CAPSULE | Freq: Every day | ORAL | Status: DC
  Administered 2023-09-11: 0.4 mg via ORAL
  Filled 2023-09-10: qty 1

## 2023-09-10 MED ORDER — FLEET ENEMA RE ENEM: 1.0000 | ENEMA | Freq: Once | RECTAL | Status: DC | PRN

## 2023-09-10 MED ORDER — ACETAMINOPHEN 325 MG PO TABS: 325.0000 mg | ORAL_TABLET | Freq: Four times a day (QID) | ORAL | Status: DC | PRN

## 2023-09-10 MED ORDER — ONDANSETRON HCL 4 MG/2ML IJ SOLN
INTRAMUSCULAR | Status: DC | PRN
  Administered 2023-09-10: 4 mg via INTRAVENOUS

## 2023-09-10 MED ORDER — LACTATED RINGERS IV SOLN: INTRAVENOUS | Status: DC

## 2023-09-10 MED ORDER — CHLORHEXIDINE GLUCONATE 0.12 % MT SOLN
15.0000 mL | Freq: Once | OROMUCOSAL | Status: AC
  Administered 2023-09-10: 15 mL via OROMUCOSAL

## 2023-09-10 MED ORDER — RIVAROXABAN 10 MG PO TABS
10.0000 mg | ORAL_TABLET | Freq: Every day | ORAL | Status: DC
  Administered 2023-09-11: 10 mg via ORAL
  Filled 2023-09-10: qty 1

## 2023-09-10 MED ORDER — ROCURONIUM BROMIDE 10 MG/ML (PF) SYRINGE
PREFILLED_SYRINGE | INTRAVENOUS | Status: DC | PRN
  Administered 2023-09-10: 5 mg via INTRAVENOUS
  Administered 2023-09-10: 55 mg via INTRAVENOUS

## 2023-09-10 MED ORDER — BUPIVACAINE-EPINEPHRINE (PF) 0.25% -1:200000 IJ SOLN
INTRAMUSCULAR | Status: DC | PRN
  Administered 2023-09-10: 30 mL via PERINEURAL

## 2023-09-10 MED ORDER — FENTANYL CITRATE (PF) 100 MCG/2ML IJ SOLN
INTRAMUSCULAR | Status: DC | PRN
  Administered 2023-09-10: 25 ug via INTRAVENOUS
  Administered 2023-09-10: 75 ug via INTRAVENOUS

## 2023-09-10 MED ORDER — ACETAMINOPHEN 10 MG/ML IV SOLN
1000.0000 mg | Freq: Once | INTRAVENOUS | Status: DC | PRN
Start: 2023-09-10 — End: 2023-09-10

## 2023-09-10 MED ORDER — HYDROMORPHONE HCL 1 MG/ML IJ SOLN
INTRAMUSCULAR | Status: AC
  Filled 2023-09-10: qty 2

## 2023-09-10 MED ORDER — PHENOL 1.4 % MT LIQD
1.0000 | OROMUCOSAL | Status: DC | PRN
Start: 2023-09-10 — End: 2023-09-11

## 2023-09-10 MED ORDER — ONDANSETRON HCL 4 MG/2ML IJ SOLN
4.0000 mg | Freq: Four times a day (QID) | INTRAMUSCULAR | Status: DC | PRN
Start: 2023-09-10 — End: 2023-09-11

## 2023-09-10 MED ORDER — OXYCODONE HCL 5 MG/5ML PO SOLN: 5.0000 mg | Freq: Once | ORAL | Status: DC | PRN

## 2023-09-10 MED ORDER — CEFAZOLIN SODIUM-DEXTROSE 2-4 GM/100ML-% IV SOLN
2.0000 g | INTRAVENOUS | Status: AC
  Administered 2023-09-10: 2 g via INTRAVENOUS
  Filled 2023-09-10: qty 100

## 2023-09-10 MED ORDER — METOCLOPRAMIDE HCL 5 MG PO TABS: 5.0000 mg | ORAL_TABLET | Freq: Three times a day (TID) | ORAL | Status: DC | PRN

## 2023-09-10 MED ORDER — DEXAMETHASONE SODIUM PHOSPHATE 10 MG/ML IJ SOLN
INTRAMUSCULAR | Status: AC
  Filled 2023-09-10: qty 1

## 2023-09-10 MED ORDER — 0.9 % SODIUM CHLORIDE (POUR BTL) OPTIME
TOPICAL | Status: DC | PRN
  Administered 2023-09-10: 1000 mL

## 2023-09-10 MED ORDER — FENTANYL CITRATE (PF) 100 MCG/2ML IJ SOLN
INTRAMUSCULAR | Status: AC
  Filled 2023-09-10: qty 2

## 2023-09-10 MED ORDER — PROPOFOL 1000 MG/100ML IV EMUL
INTRAVENOUS | Status: AC
  Filled 2023-09-10: qty 100

## 2023-09-10 MED ORDER — DEXMEDETOMIDINE HCL IN NACL 80 MCG/20ML IV SOLN
INTRAVENOUS | Status: DC | PRN
  Administered 2023-09-10: 8 ug via INTRAVENOUS
  Administered 2023-09-10: 4 ug via INTRAVENOUS

## 2023-09-10 MED ORDER — METOCLOPRAMIDE HCL 5 MG/ML IJ SOLN: 5.0000 mg | Freq: Three times a day (TID) | INTRAMUSCULAR | Status: DC | PRN

## 2023-09-10 MED ORDER — DEXAMETHASONE SODIUM PHOSPHATE 10 MG/ML IJ SOLN
INTRAMUSCULAR | Status: DC | PRN
  Administered 2023-09-10: 4 mg

## 2023-09-10 MED ORDER — OXYCODONE HCL 5 MG PO TABS
5.0000 mg | ORAL_TABLET | ORAL | Status: DC | PRN
  Administered 2023-09-10 – 2023-09-11 (×4): 10 mg via ORAL
  Filled 2023-09-10 (×4): qty 2

## 2023-09-10 MED ORDER — ONDANSETRON HCL 4 MG/2ML IJ SOLN
4.0000 mg | Freq: Once | INTRAMUSCULAR | Status: AC | PRN
  Administered 2023-09-10: 4 mg via INTRAVENOUS

## 2023-09-10 MED ORDER — OXYCODONE HCL 5 MG PO TABS: 5.0000 mg | ORAL_TABLET | Freq: Once | ORAL | Status: DC | PRN

## 2023-09-10 MED ORDER — PANTOPRAZOLE SODIUM 40 MG PO TBEC
80.0000 mg | DELAYED_RELEASE_TABLET | Freq: Every day | ORAL | Status: DC
  Administered 2023-09-11: 80 mg via ORAL
  Filled 2023-09-10: qty 2

## 2023-09-10 MED ORDER — MENTHOL 3 MG MT LOZG: 1.0000 | LOZENGE | OROMUCOSAL | Status: DC | PRN

## 2023-09-10 MED ORDER — ACETAMINOPHEN 10 MG/ML IV SOLN
1000.0000 mg | Freq: Once | INTRAVENOUS | Status: AC
  Administered 2023-09-10: 1000 mg via INTRAVENOUS
  Filled 2023-09-10: qty 100

## 2023-09-10 MED ORDER — DEXAMETHASONE SODIUM PHOSPHATE 10 MG/ML IJ SOLN
10.0000 mg | Freq: Once | INTRAMUSCULAR | Status: AC
  Administered 2023-09-11: 10 mg via INTRAVENOUS
  Filled 2023-09-10: qty 1

## 2023-09-10 MED ORDER — DEXAMETHASONE SODIUM PHOSPHATE 10 MG/ML IJ SOLN: 8.0000 mg | Freq: Once | INTRAMUSCULAR | Status: DC

## 2023-09-10 MED ORDER — PROPOFOL 500 MG/50ML IV EMUL
INTRAVENOUS | Status: DC | PRN
  Administered 2023-09-10: 25 ug/kg/min via INTRAVENOUS

## 2023-09-10 MED ORDER — DOCUSATE SODIUM 100 MG PO CAPS
100.0000 mg | ORAL_CAPSULE | Freq: Two times a day (BID) | ORAL | Status: DC
  Administered 2023-09-10 – 2023-09-11 (×2): 100 mg via ORAL
  Filled 2023-09-10 (×2): qty 1

## 2023-09-10 MED ORDER — METHOCARBAMOL 1000 MG/10ML IJ SOLN: 500.0000 mg | Freq: Four times a day (QID) | INTRAMUSCULAR | Status: DC | PRN

## 2023-09-10 MED ORDER — ONDANSETRON HCL 4 MG PO TABS: 4.0000 mg | ORAL_TABLET | Freq: Four times a day (QID) | ORAL | Status: DC | PRN

## 2023-09-10 MED ORDER — POVIDONE-IODINE 10 % EX SWAB: 2.0000 | Freq: Once | CUTANEOUS | Status: DC

## 2023-09-10 MED ORDER — CEFAZOLIN SODIUM-DEXTROSE 2-4 GM/100ML-% IV SOLN
2.0000 g | Freq: Four times a day (QID) | INTRAVENOUS | Status: AC
  Administered 2023-09-10 (×2): 2 g via INTRAVENOUS
  Filled 2023-09-10 (×2): qty 100

## 2023-09-10 MED ORDER — LIDOCAINE HCL (CARDIAC) PF 100 MG/5ML IV SOSY
PREFILLED_SYRINGE | INTRAVENOUS | Status: DC | PRN
Start: 2023-09-10 — End: 2023-09-10
  Administered 2023-09-10: 100 mg via INTRAVENOUS

## 2023-09-10 MED ORDER — DIPHENHYDRAMINE HCL 12.5 MG/5ML PO ELIX: 12.5000 mg | ORAL_SOLUTION | ORAL | Status: DC | PRN

## 2023-09-10 MED ORDER — MORPHINE SULFATE (PF) 2 MG/ML IV SOLN: 1.0000 mg | INTRAVENOUS | Status: DC | PRN

## 2023-09-10 MED ORDER — BUPIVACAINE-EPINEPHRINE (PF) 0.25% -1:200000 IJ SOLN
INTRAMUSCULAR | Status: AC
Start: 2023-09-10 — End: ?
  Filled 2023-09-10: qty 30

## 2023-09-10 MED ORDER — LIDOCAINE HCL (PF) 2 % IJ SOLN
INTRAMUSCULAR | Status: AC
  Filled 2023-09-10: qty 5

## 2023-09-10 MED ORDER — SODIUM CHLORIDE 0.9 % IV SOLN: INTRAVENOUS | Status: DC

## 2023-09-10 MED ORDER — BISACODYL 10 MG RE SUPP: 10.0000 mg | Freq: Every day | RECTAL | Status: DC | PRN

## 2023-09-10 MED ORDER — PROPOFOL 10 MG/ML IV BOLUS
INTRAVENOUS | Status: DC | PRN
  Administered 2023-09-10: 160 mg via INTRAVENOUS

## 2023-09-10 MED ORDER — POLYETHYLENE GLYCOL 3350 17 G PO PACK
17.0000 g | PACK | Freq: Every day | ORAL | Status: DC | PRN
Start: 2023-09-10 — End: 2023-09-11

## 2023-09-10 MED ORDER — SIMVASTATIN 40 MG PO TABS
40.0000 mg | ORAL_TABLET | Freq: Every day | ORAL | Status: DC
  Filled 2023-09-10: qty 1

## 2023-09-10 MED ORDER — PROPOFOL 10 MG/ML IV BOLUS
INTRAVENOUS | Status: AC
  Filled 2023-09-10: qty 20

## 2023-09-10 MED ORDER — SUGAMMADEX SODIUM 200 MG/2ML IV SOLN
INTRAVENOUS | Status: DC | PRN
  Administered 2023-09-10: 150 mg via INTRAVENOUS

## 2023-09-10 MED ORDER — TRANEXAMIC ACID-NACL 1000-0.7 MG/100ML-% IV SOLN
1000.0000 mg | INTRAVENOUS | Status: AC
  Administered 2023-09-10: 1000 mg via INTRAVENOUS
  Filled 2023-09-10: qty 100

## 2023-09-10 MED ORDER — HYDROMORPHONE HCL 1 MG/ML IJ SOLN
0.2500 mg | INTRAMUSCULAR | Status: DC | PRN
  Administered 2023-09-10 (×3): 0.5 mg via INTRAVENOUS

## 2023-09-10 SURGICAL SUPPLY — 36 items
BAG COUNTER SPONGE SURGICOUNT (BAG) IMPLANT
BAG ZIPLOCK 12X15 (MISCELLANEOUS) IMPLANT
BLADE SAG 18X100X1.27 (BLADE) ×1 IMPLANT
COVER PERINEAL POST (MISCELLANEOUS) ×1 IMPLANT
COVER SURGICAL LIGHT HANDLE (MISCELLANEOUS) ×1 IMPLANT
CUP ACET PINNACLE SECTR 56MM (Hips) IMPLANT
DERMABOND ADVANCED .7 DNX12 (GAUZE/BANDAGES/DRESSINGS) ×1 IMPLANT
DRAPE FOOT SWITCH (DRAPES) ×1 IMPLANT
DRAPE STERI IOBAN 125X83 (DRAPES) ×1 IMPLANT
DRAPE U-SHAPE 47X51 STRL (DRAPES) ×2 IMPLANT
DRSG AQUACEL AG ADV 3.5X10 (GAUZE/BANDAGES/DRESSINGS) ×1 IMPLANT
DURAPREP 26ML APPLICATOR (WOUND CARE) ×1 IMPLANT
ELECT REM PT RETURN 15FT ADLT (MISCELLANEOUS) ×1 IMPLANT
GLOVE BIO SURGEON STRL SZ 6.5 (GLOVE) IMPLANT
GLOVE BIO SURGEON STRL SZ7 (GLOVE) IMPLANT
GLOVE BIO SURGEON STRL SZ8 (GLOVE) ×1 IMPLANT
GLOVE BIOGEL PI IND STRL 7.0 (GLOVE) IMPLANT
GLOVE BIOGEL PI IND STRL 8 (GLOVE) ×1 IMPLANT
GOWN STRL REUS W/ TWL LRG LVL3 (GOWN DISPOSABLE) ×2 IMPLANT
HEAD M SROM 36MM PLUS 1.5 (Hips) IMPLANT
HOLDER FOLEY CATH W/STRAP (MISCELLANEOUS) ×1 IMPLANT
KIT TURNOVER KIT A (KITS) IMPLANT
MANIFOLD NEPTUNE II (INSTRUMENTS) ×1 IMPLANT
PACK ANTERIOR HIP CUSTOM (KITS) ×1 IMPLANT
PENCIL SMOKE EVACUATOR (MISCELLANEOUS) IMPLANT
PENCIL SMOKE EVACUATOR COATED (MISCELLANEOUS) ×1 IMPLANT
PINNACLE ALTRX PLUS 4 N 36X56 (Hips) IMPLANT
SPIKE FLUID TRANSFER (MISCELLANEOUS) ×1 IMPLANT
STEM FEM ACTIS HIGH SZ7 (Stem) IMPLANT
SUT ETHIBOND NAB CT1 #1 30IN (SUTURE) ×1 IMPLANT
SUT MNCRL AB 4-0 PS2 18 (SUTURE) ×1 IMPLANT
SUT VIC AB 2-0 CT1 TAPERPNT 27 (SUTURE) ×2 IMPLANT
SUTURE STRATFX 0 PDS 27 VIOLET (SUTURE) ×1 IMPLANT
TOWEL GREEN STERILE FF (TOWEL DISPOSABLE) ×1 IMPLANT
TRAY FOLEY MTR SLVR 16FR STAT (SET/KITS/TRAYS/PACK) ×1 IMPLANT
TUBE SUCTION HIGH CAP CLEAR NV (SUCTIONS) ×1 IMPLANT

## 2023-09-10 NOTE — Progress Notes (Signed)
 Took Tylenol  500mg  at 0600 today.

## 2023-09-10 NOTE — Anesthesia Procedure Notes (Signed)
 Procedure Name: Intubation Date/Time: 09/10/2023 12:20 PM  Performed by: Alwyn Juba, CRNAPre-anesthesia Checklist: Patient identified, Emergency Drugs available, Suction available, Patient being monitored and Timeout performed Patient Re-evaluated:Patient Re-evaluated prior to induction Oxygen Delivery Method: Circle system utilized Preoxygenation: Pre-oxygenation with 100% oxygen Induction Type: IV induction Ventilation: Mask ventilation without difficulty Laryngoscope Size: Mac and 4 Grade View: Grade I Tube type: Oral Tube size: 7.5 mm Number of attempts: 1 Airway Equipment and Method: Stylet Placement Confirmation: ETT inserted through vocal cords under direct vision, positive ETCO2 and breath sounds checked- equal and bilateral Secured at: 22 cm Tube secured with: Tape Dental Injury: Teeth and Oropharynx as per pre-operative assessment

## 2023-09-10 NOTE — Interval H&P Note (Signed)
 History and Physical Interval Note:  09/10/2023 9:50 AM  Fred Perkins  has presented today for surgery, with the diagnosis of Right Hip Osteoarthritis.  The various methods of treatment have been discussed with the patient and family. After consideration of risks, benefits and other options for treatment, the patient has consented to  Procedure(s): ARTHROPLASTY, HIP, TOTAL, ANTERIOR APPROACH (Right) as a surgical intervention.  The patient's history has been reviewed, patient examined, no change in status, stable for surgery.  I have reviewed the patient's chart and labs.  Questions were answered to the patient's satisfaction.     Samuel Crock Laurens Matheny

## 2023-09-10 NOTE — Transfer of Care (Signed)
 Immediate Anesthesia Transfer of Care Note  Patient: Fred Perkins  Procedure(s) Performed: ARTHROPLASTY, HIP, TOTAL, ANTERIOR APPROACH (Right: Hip)  Patient Location: PACU  Anesthesia Type:General  Level of Consciousness: awake, alert , and patient cooperative  Airway & Oxygen Therapy: Patient Spontanous Breathing and Patient connected to face mask oxygen  Post-op Assessment: Report given to RN and Post -op Vital signs reviewed and stable  Post vital signs: Reviewed and stable  Last Vitals:  Vitals Value Taken Time  BP 143/65 09/10/23 1416  Temp    Pulse 63 09/10/23 1418  Resp 15 09/10/23 1418  SpO2 100 % 09/10/23 1418  Vitals shown include unfiled device data.  Last Pain:  Vitals:   09/10/23 1031  TempSrc: Oral  PainSc: 5       Patients Stated Pain Goal: 4 (09/10/23 1031)  Complications: No notable events documented.

## 2023-09-10 NOTE — Op Note (Signed)
 OPERATIVE REPORT- TOTAL HIP ARTHROPLASTY   PREOPERATIVE DIAGNOSIS: Osteoarthritis of the Right hip.   POSTOPERATIVE DIAGNOSIS: Osteoarthritis of the Right  hip.   PROCEDURE: Right total hip arthroplasty, anterior approach.   SURGEON: Liliane Rei, MD   ASSISTANT: Sharlynn Dear, PA-C  ANESTHESIA:  Spinal  ESTIMATED BLOOD LOSS:-250 mL    DRAINS: None  COMPLICATIONS: None   CONDITION: PACU - hemodynamically stable.   BRIEF CLINICAL NOTE: Fred Perkins is a 78 y.o. male who has advanced end-  stage arthritis of their Right  hip with progressively worsening pain and  dysfunction.The patient has failed nonoperative management and presents for  total hip arthroplasty.   PROCEDURE IN DETAIL: After successful administration of spinal  anesthetic, the traction boots for the [facility] bed were placed on both  feet and the patient was placed onto the [facility] bed, boots placed into the leg  holders. The Right hip was then isolated from the perineum with plastic  drapes and prepped and draped in the usual sterile fashion. ASIS and  greater trochanter were marked and a oblique incision was made, starting  at about 1 cm lateral and 2 cm distal to the ASIS and coursing towards  the anterior cortex of the femur. The skin was cut with a 10 blade  through subcutaneous tissue to the level of the fascia overlying the  tensor fascia lata muscle. The fascia was then incised in line with the  incision at the junction of the anterior third and posterior 2/3rd. The  muscle was teased off the fascia and then the interval between the TFL  and the rectus was developed. The Hohmann retractor was then placed at  the top of the femoral neck over the capsule. The vessels overlying the  capsule were cauterized and the fat on top of the capsule was removed.  A Hohmann retractor was then placed anterior underneath the rectus  femoris to give exposure to the entire anterior capsule. A T-shaped   capsulotomy was performed. The edges were tagged and the femoral head  was identified.       Osteophytes are removed off the superior acetabulum.  The femoral neck was then cut in situ with an oscillating saw. Traction  was then applied to the left lower extremity utilizing the [facility]  traction. The femoral head was then removed. Retractors were placed  around the acetabulum and then circumferential removal of the labrum was  performed. Osteophytes were also removed. Reaming starts at 51 mm to  medialize and  Increased in 2 mm increments to 55 mm. We reamed in  approximately 40 degrees of abduction, 20 degrees anteversion. A 56 mm  pinnacle acetabular shell was then impacted in anatomic position under  fluoroscopic guidance with excellent purchase. We did not need to place  any additional dome screws. A 36 mm neutral + 4 marathon liner was then  placed into the acetabular shell.       The femoral lift was then placed along the lateral aspect of the femur  just distal to the vastus ridge. The leg was  externally rotated and capsule  was stripped off the inferior aspect of the femoral neck down to the  level of the lesser trochanter, this was done with electrocautery. The femur was lifted after this was performed. The  leg was then placed in an extended and adducted position essentially delivering the femur. We also removed the capsule superiorly and the piriformis from the piriformis fossa to  gain excellent exposure of the  proximal femur. Rongeur was used to remove some cancellous bone to get  into the lateral portion of the proximal femur for placement of the  initial starter reamer. The starter broaches was placed  the starter broach  and was shown to go down the center of the canal. Broaching  with the Actis system was then performed starting at size 0  coursing  Up to size 7. A size 7 had excellent torsional and rotational  and axial stability. The trial high offset neck was then placed   with a 36 + 1.5 trial head. The hip was then reduced. We confirmed that  the stem was in the canal both on AP and lateral x-rays. It also has excellent sizing. The hip was reduced with outstanding stability through full extension and full external rotation.. AP pelvis was taken and the leg lengths were measured and found to be equal. Hip was then dislocated again and the femoral head and neck removed. The  femoral broach was removed. Size 7 Actis stem with a high offset  neck was then impacted into the femur following native anteversion. Has  excellent purchase in the canal. Excellent torsional and rotational and  axial stability. It is confirmed to be in the canal on AP and lateral  fluoroscopic views. The 36 + 1.5 metal head was placed and the hip  reduced with outstanding stability. Again AP pelvis was taken and it  confirmed that the leg lengths were equal. The wound was then copiously  irrigated with saline solution and the capsule reattached and repaired  with Ethibond suture. 30 ml of .25% Bupivicaine was  injected into the capsule and into the edge of the tensor fascia lata as well as subcutaneous tissue. The fascia overlying the tensor fascia lata was then closed with a running #1 V-Loc. Subcu was closed with interrupted 2-0 Vicryl and subcuticular running 4-0 Monocryl. Incision was cleaned  and dried. Steri-Strips and a bulky sterile dressing applied. The patient was awakened and transported to  recovery in stable condition.        Please note that a surgical assistant was a medical necessity for this procedure to perform it in a safe and expeditious manner. Assistant was necessary to provide appropriate retraction of vital neurovascular structures and to prevent femoral fracture and allow for anatomic placement of the prosthesis.  Liliane Rei, M.D.

## 2023-09-10 NOTE — Anesthesia Postprocedure Evaluation (Signed)
 Anesthesia Post Note  Patient: Fred Perkins  Procedure(s) Performed: ARTHROPLASTY, HIP, TOTAL, ANTERIOR APPROACH (Right: Hip)     Anesthesia Type: General Anesthetic complications: no   No notable events documented.  Last Vitals:  Vitals:   09/10/23 1500 09/10/23 1515  BP: (!) 121/56 136/77  Pulse: (!) 57 (!) 57  Resp: 18 16  Temp: 36.6 C   SpO2: 99% 95%    Last Pain:  Vitals:   09/10/23 1515  TempSrc:   PainSc: 4                  Rosalita Combe

## 2023-09-10 NOTE — Plan of Care (Signed)

## 2023-09-10 NOTE — Care Plan (Signed)
 Ortho Bundle Case Management Note  Patient Details  Name: Fred Perkins MRN: [id number] Date of Birth: [birth year]  RT THA on 09/10/23  DCP: Home with wife DME: RW, ordered through [employer] PT: HEP                   DME Arranged:  Walker rolling DME Agency:  [employer]  HH Arranged:    HH Agency:     Additional Comments: Please contact me with any questions of if this plan should need to change.  Kathlene Paradise, Case Manager [facility] [phone] Ext. [phone]   09/10/2023, 9:59 AM

## 2023-09-10 NOTE — Discharge Instructions (Addendum)
Gaynelle Arabian, MD Total Joint Specialist [facility] [place] [address], [address] [place], [place] [address] [phone]  ANTERIOR APPROACH TOTAL HIP REPLACEMENT POSTOPERATIVE DIRECTIONS     Hip Rehabilitation, Guidelines Following Surgery  The results of a hip operation are greatly improved after range of motion and muscle strengthening exercises. Follow all safety measures which are given to protect your hip. If any of these exercises cause increased pain or swelling in your joint, decrease the amount until you are comfortable again. Then slowly increase the exercises. Call your caregiver if you have problems or questions.   BLOOD CLOT PREVENTION Take a 10 mg Xarelto once a day for three weeks following surgery. Then discontinue. You may resume your vitamins/supplements once you have discontinued the Xarelto. Do not take any NSAIDs (Advil, Aleve, Ibuprofen, Meloxicam, etc.) until you have discontinued the Xarelto.   HOME CARE INSTRUCTIONS  Remove items at home which could result in a fall. This includes throw rugs or furniture in walking pathways.  ICE to the affected hip as frequently as 20-30 minutes an hour and then as needed for pain and swelling. Continue to use ice on the hip for pain and swelling from surgery. You may notice swelling that will progress down to the foot and ankle. This is normal after surgery. Elevate the leg when you are not up walking on it.   Continue to use the breathing machine which will help keep your temperature down.  It is common for your temperature to cycle up and down following surgery, especially at night when you are not up moving around and exerting yourself.  The breathing machine keeps your lungs expanded and your temperature down.  DIET You may resume your previous home diet once your are discharged from the hospital.  DRESSING / WOUND CARE / SHOWERING You have an adhesive waterproof bandage over the incision. Leave this in place  until your first follow-up appointment. Once you remove this you will not need to place another bandage.  You may begin showering 3 days following surgery, but do not submerge the incision under water.  ACTIVITY For the first 3-5 days, it is important to rest and keep the operative leg elevated. You should, as a general rule, rest for 50 minutes and walk/stretch for 10 minutes per hour. After 5 days, you may slowly increase activity as tolerated.  Perform the exercises you were provided twice a day for about 15-20 minutes each session. Begin these 2 days following surgery. Walk with your walker as instructed. Use the walker until you are comfortable transitioning to a cane. Walk with the cane in the opposite hand of the operative leg. You may discontinue the cane once you are comfortable and walking steadily. Avoid periods of inactivity such as sitting longer than an hour when not asleep. This helps prevent blood clots.  Do not drive a car for 6 weeks or until released by your surgeon.  Do not drive while taking narcotics.  TED HOSE STOCKINGS Wear the elastic stockings on both legs for three weeks following surgery during the day. You may remove them at night while sleeping.  WEIGHT BEARING Weight bearing as tolerated with assist device (walker, cane, etc) as directed, use it as long as suggested by your surgeon or therapist, typically at least 4-6 weeks.  POSTOPERATIVE CONSTIPATION PROTOCOL Constipation - defined medically as fewer than three stools per week and severe constipation as less than one stool per week.  One of the most common issues patients  have following surgery is constipation.  Even if you have a regular bowel pattern at home, your normal regimen is likely to be disrupted due to multiple reasons following surgery.  Combination of anesthesia, postoperative narcotics, change in appetite and fluid intake all can affect your bowels.  In order to avoid complications following surgery,  here are some recommendations in order to help you during your recovery period.  Colace (docusate) - Pick up an over-the-counter form of Colace or another stool softener and take twice a day as long as you are requiring postoperative pain medications.  Take with a full glass of water daily.  If you experience loose stools or diarrhea, hold the colace until you stool forms back up.  If your symptoms do not get better within 1 week or if they get worse, check with your doctor. Dulcolax (bisacodyl) - Pick up over-the-counter and take as directed by the product packaging as needed to assist with the movement of your bowels.  Take with a full glass of water.  Use this product as needed if not relieved by Colace only.  MiraLax (polyethylene glycol) - Pick up over-the-counter to have on hand.  MiraLax is a solution that will increase the amount of water in your bowels to assist with bowel movements.  Take as directed and can mix with a glass of water, juice, soda, coffee, or tea.  Take if you go more than two days without a movement.Do not use MiraLax more than once per day. Call your doctor if you are still constipated or irregular after using this medication for 7 days in a row.  If you continue to have problems with postoperative constipation, please contact the office for further assistance and recommendations.  If you experience "the worst abdominal pain ever" or develop nausea or vomiting, please contact the office immediatly for further recommendations for treatment.  ITCHING  If you experience itching with your medications, try taking only a single pain pill, or even half a pain pill at a time.  You can also use Benadryl over the counter for itching or also to help with sleep.   MEDICATIONS See your medication summary on the "After Visit Summary" that the nursing staff will review with you prior to discharge.  You may have some home medications which will be placed on hold until you complete the course  of blood thinner medication.  It is important for you to complete the blood thinner medication as prescribed by your surgeon.  Continue your approved medications as instructed at time of discharge.  PRECAUTIONS If you experience chest pain or shortness of breath - call 911 immediately for transfer to the hospital emergency department.  If you develop a fever greater that 101 F, purulent drainage from wound, increased redness or drainage from wound, foul odor from the wound/dressing, or calf pain - CONTACT YOUR SURGEON.                                                   FOLLOW-UP APPOINTMENTS Make sure you keep all of your appointments after your operation with your surgeon and caregivers. You should call the office at the above phone number and make an appointment for approximately two weeks after the date of your surgery or on the date instructed by your surgeon outlined in the "After Visit Summary".  RANGE OF  MOTION AND STRENGTHENING EXERCISES  These exercises are designed to help you keep full movement of your hip joint. Follow your caregiver's or physical therapist's instructions. Perform all exercises about fifteen times, three times per day or as directed. Exercise both hips, even if you have had only one joint replacement. These exercises can be done on a training (exercise) mat, on the floor, on a table or on a bed. Use whatever works the best and is most comfortable for you. Use music or television while you are exercising so that the exercises are a pleasant break in your day. This will make your life better with the exercises acting as a break in routine you can look forward to.  Lying on your back, slowly slide your foot toward your buttocks, raising your knee up off the floor. Then slowly slide your foot back down until your leg is straight again.  Lying on your back spread your legs as far apart as you can without causing discomfort.  Lying on your side, raise your upper leg and foot  straight up from the floor as far as is comfortable. Slowly lower the leg and repeat.  Lying on your back, tighten up the muscle in the front of your thigh (quadriceps muscles). You can do this by keeping your leg straight and trying to raise your heel off the floor. This helps strengthen the largest muscle supporting your knee.  Lying on your back, tighten up the muscles of your buttocks both with the legs straight and with the knee bent at a comfortable angle while keeping your heel on the floor.   POST-OPERATIVE OPIOID TAPER INSTRUCTIONS: It is important to wean off of your opioid medication as soon as possible. If you do not need pain medication after your surgery it is ok to stop day one. Opioids include: Codeine, Hydrocodone(Norco, Vicodin), Oxycodone(Percocet, oxycontin) and hydromorphone amongst others.  Long term and even short term use of opiods can cause: Increased pain response Dependence Constipation Depression Respiratory depression And more.  Withdrawal symptoms can include Flu like symptoms Nausea, vomiting And more Techniques to manage these symptoms Hydrate well Eat regular healthy meals Stay active Use relaxation techniques(deep breathing, meditating, yoga) Do Not substitute Alcohol to help with tapering If you have been on opioids for less than two weeks and do not have pain than it is ok to stop all together.  Plan to wean off of opioids This plan should start within one week post op of your joint replacement. Maintain the same interval or time between taking each dose and first decrease the dose.  Cut the total daily intake of opioids by one tablet each day Next start to increase the time between doses. The last dose that should be eliminated is the evening dose.   IF YOU ARE TRANSFERRED TO A SKILLED REHAB FACILITY If the patient is transferred to a skilled rehab facility following release from the hospital, a list of the current medications will be sent to the  facility for the patient to continue.  When discharged from the skilled rehab facility, please have the facility set up the patient's [place] prior to being released. Also, the skilled facility will be responsible for providing the patient with their medications at time of release from the facility to include their pain medication, the muscle relaxants, and their blood thinner medication. If the patient is still at the rehab facility at time of the two week follow up appointment, the skilled rehab facility will  also need to assist the patient in arranging follow up appointment in our office and any transportation needs.  MAKE SURE YOU:  Understand these instructions.  Get help right away if you are not doing well or get worse.   DENTAL ANTIBIOTICS:  In most cases prophylactic antibiotics for Dental procdeures after total joint surgery are not necessary.  Exceptions are as follows:  1. History of prior total joint infection  2. Severely immunocompromised (Organ Transplant, cancer chemotherapy, Rheumatoid biologic meds such as Symsonia)  3. Poorly controlled diabetes (A1C &gt; 8.0, blood glucose over 200)  If you have one of these conditions, contact your surgeon for an antibiotic prescription, prior to your dental procedure.    Pick up stool softner and laxative for home use following surgery while on pain medications. Do not submerge incision under water. Please use good hand washing techniques while changing dressing each day. May shower starting three days after surgery. Please use a clean towel to pat the incision dry following showers. Continue to use ice for pain and swelling after surgery. Do not use any lotions or creams on the incision until instructed by your surgeon.  Information on my medicine - XARELTO (Rivaroxaban)  This medication education was reviewed with me or my healthcare representative as part of my discharge preparation.  The pharmacist that  spoke with me during my hospital stay was:    Why was Xarelto prescribed for you? Xarelto was prescribed for you to reduce the risk of blood clots forming after orthopedic surgery. The medical term for these abnormal blood clots is venous thromboembolism (VTE).  What do you need to know about xarelto ? Take your Xarelto ONCE DAILY at the same time every day. You may take it either with or without food.  If you have difficulty swallowing the tablet whole, you may crush it and mix in applesauce just prior to taking your dose.  Take Xarelto exactly as prescribed by your doctor and DO NOT stop taking Xarelto without talking to the doctor who prescribed the medication.  Stopping without other VTE prevention medication to take the place of Xarelto may increase your risk of developing a clot.  After discharge, you should have regular check-up appointments with your healthcare provider that is prescribing your Xarelto.    What do you do if you miss a dose? If you miss a dose, take it as soon as you remember on the same day then continue your regularly scheduled once daily regimen the next day. Do not take two doses of Xarelto on the same day.   Important Safety Information A possible side effect of Xarelto is bleeding. You should call your healthcare provider right away if you experience any of the following: Bleeding from an injury or your nose that does not stop. Unusual colored urine (red or dark brown) or unusual colored stools (red or black). Unusual bruising for unknown reasons. A serious fall or if you hit your head (even if there is no bleeding).  Some medicines may interact with Xarelto and might increase your risk of bleeding while on Xarelto. To help avoid this, consult your healthcare provider or pharmacist prior to using any new prescription or non-prescription medications, including herbals, vitamins, non-steroidal anti-inflammatory drugs (NSAIDs) and supplements.  This  website has more information on Xarelto: [email or web address]

## 2023-09-10 NOTE — Evaluation (Signed)
 Physical Therapy Evaluation Patient Details Name: Fred Perkins MRN: [id number] DOB: Dec 27, 1945 Today's Date: 09/10/2023  History of Present Illness  78 yo male presents to therapy s/p R THA, anterior approach on 09/10/2023 due to failure of conservative measures. Pt PMH includes but is not limited to: anemia, GI bleed, arthritis, HLD, CAD s/p stent placement and back surgery.  Clinical Impression    CLAUDIE Perkins is a 78 y.o. male POD 0 s/p R THA. Patient reports mod I with SPC with mobility at baseline. Patient is now limited by functional impairments (see PT problem list below) and requires CGA for bed mobility and CGA for transfers. Patient was able to ambulate 50 feet with RW and CGA level of assist. Patient instructed in exercise to facilitate ROM and circulation to manage edema. Patient will benefit from continued skilled PT interventions to address impairments and progress towards PLOF. Acute PT will follow to progress mobility and stair training in preparation for safe discharge home with family support and HEP.       If plan is discharge home, recommend the following: A little help with walking and/or transfers;A little help with bathing/dressing/bathroom;Assistance with cooking/housework;Assist for transportation;Help with stairs or ramp for entrance   Can travel by private vehicle        Equipment Recommendations Rolling walker (2 wheels)  Recommendations for Other Services       Functional Status Assessment Patient has had a recent decline in their functional status and demonstrates the ability to make significant improvements in function in a reasonable and predictable amount of time.     Precautions / Restrictions Precautions Precautions: Fall Restrictions Weight Bearing Restrictions Per Provider Order: No      Mobility  Bed Mobility Overal bed mobility: Needs Assistance Bed Mobility: Supine to Sit     Supine to sit: Contact guard, HOB elevated, Used rails      General bed mobility comments: min cues    Transfers Overall transfer level: Needs assistance Equipment used: Rolling walker (2 wheels) Transfers: Sit to/from Stand Sit to Stand: Contact guard assist           General transfer comment: min cues pull to stand    Ambulation/Gait Ambulation/Gait assistance: Contact guard assist Gait Distance (Feet): 50 Feet Assistive device: Rolling walker (2 wheels) Gait Pattern/deviations: Step-to pattern, Antalgic, Trunk flexed, Decreased stance time - right Gait velocity: decreased     General Gait Details: pt will require ed for RW management for more fluid gait pattern and maintaining proper distance from RW, B UE support at RW to offload R LE in stance phase  Stairs            Wheelchair Mobility     Tilt Bed    Modified Rankin (Stroke Patients Only)       Balance Overall balance assessment: Needs assistance Sitting-balance support: No upper extremity supported Sitting balance-Leahy Scale: Good     Standing balance support: Bilateral upper extremity supported, During functional activity, Reliant on assistive device for balance Standing balance-Leahy Scale: Poor                               Pertinent Vitals/Pain Pain Assessment Pain Assessment: 0-10 Pain Score: 5  Pain Location: R hip and LE Pain Descriptors / Indicators: Aching, Constant, Discomfort, Grimacing, Operative site guarding Pain Intervention(s): Limited activity within patient's tolerance, Monitored during session, Premedicated before session, Repositioned, Ice applied    Home  Living Family/patient expects to be discharged to:: Private residence Living Arrangements: Spouse/significant other Available Help at Discharge: Family Type of Home: House (condo) Home Access: Level entry       Home Layout: One level Home Equipment: Cane - single point      Prior Function Prior Level of Function : Independent/Modified  Independent;Driving;Working/employed             Mobility Comments: mod I with use of SPC for all functional mobility and ADL tasks       Extremity/Trunk Assessment        Lower Extremity Assessment Lower Extremity Assessment: RLE deficits/detail RLE Deficits / Details: ankle DF/PF 5/5 RLE Sensation: WNL    Cervical / Trunk Assessment Cervical / Trunk Assessment: Back Surgery  Communication   Communication Communication: No apparent difficulties    Cognition Arousal: Alert Behavior During Therapy: WFL for tasks assessed/performed   PT - Cognitive impairments: No apparent impairments                         Following commands: Intact       Cueing       General Comments      Exercises Total Joint Exercises Ankle Circles/Pumps: AROM, Both, 10 reps   Assessment/Plan    PT Assessment Patient needs continued PT services  PT Problem List Decreased strength;Decreased range of motion;Decreased balance;Decreased activity tolerance;Decreased mobility;Decreased coordination;Pain       PT Treatment Interventions DME instruction;Gait training;Functional mobility training;Therapeutic activities;Therapeutic exercise;Balance training;Neuromuscular re-education;Patient/family education;Modalities    PT Goals (Current goals can be found in the Care Plan section)  Acute Rehab PT Goals Patient Stated Goal: to be able to return to work at [employer] PT Goal Formulation: With patient Time For Goal Achievement: 09/24/23 Potential to Achieve Goals: Good    Frequency 7X/week     Co-evaluation               AM-PAC PT "6 Clicks" Mobility  Outcome Measure Help needed turning from your back to your side while in a flat bed without using bedrails?: None Help needed moving from lying on your back to sitting on the side of a flat bed without using bedrails?: A Little Help needed moving to and from a bed to a chair (including a wheelchair)?: A Little Help  needed standing up from a chair using your arms (e.g., wheelchair or bedside chair)?: A Little Help needed to walk in hospital room?: A Little Help needed climbing 3-5 steps with a railing? : A Lot 6 Click Score: 18    End of Session Equipment Utilized During Treatment: Gait belt Activity Tolerance: Patient tolerated treatment well;No increased pain Patient left: in chair;with family/visitor present Nurse Communication: Mobility status PT Visit Diagnosis: Unsteadiness on feet (R26.81);Other abnormalities of gait and mobility (R26.89);Muscle weakness (generalized) (M62.81);Difficulty in walking, not elsewhere classified (R26.2);Pain Pain - Right/Left: Right Pain - part of body: Hip;Leg    Time: 1610-9604 PT Time Calculation (min) (ACUTE ONLY): 20 min   Charges:   PT Evaluation $PT Eval Low Complexity: 1 Low   PT General Charges $$ ACUTE PT VISIT: 1 Visit         Cary Clarks, PT Acute Rehab   Annalee Kiang 09/10/2023, 5:39 PM

## 2023-09-11 ENCOUNTER — Encounter (HOSPITAL_COMMUNITY): Payer: Self-pay | Admitting: Orthopedic Surgery

## 2023-09-11 ENCOUNTER — Other Ambulatory Visit (HOSPITAL_COMMUNITY): Payer: Self-pay

## 2023-09-11 ENCOUNTER — Other Ambulatory Visit: Payer: Self-pay

## 2023-09-11 DIAGNOSIS — M1611 Unilateral primary osteoarthritis, right hip: Secondary | ICD-10-CM | POA: Diagnosis not present

## 2023-09-11 LAB — ABO/RH: ABO/RH(D): A POS

## 2023-09-11 LAB — CBC
HCT: 34.9 % — ABNORMAL LOW (ref 39.0–52.0)
Hemoglobin: 11.3 g/dL — ABNORMAL LOW (ref 13.0–17.0)
MCH: 30.1 pg (ref 26.0–34.0)
MCHC: 32.4 g/dL (ref 30.0–36.0)
MCV: 93.1 fL (ref 80.0–100.0)
Platelets: 265 10*3/uL (ref 150–400)
RBC: 3.75 MIL/uL — ABNORMAL LOW (ref 4.22–5.81)
RDW: 14.1 % (ref 11.5–15.5)
WBC: 15.6 10*3/uL — ABNORMAL HIGH (ref 4.0–10.5)
nRBC: 0 % (ref 0.0–0.2)

## 2023-09-11 LAB — BASIC METABOLIC PANEL WITH GFR
Anion gap: 7 (ref 5–15)
BUN: 9 mg/dL (ref 8–23)
CO2: 25 mmol/L (ref 22–32)
Calcium: 8.5 mg/dL — ABNORMAL LOW (ref 8.9–10.3)
Chloride: 101 mmol/L (ref 98–111)
Creatinine, Ser: 0.86 mg/dL (ref 0.61–1.24)
GFR, Estimated: >60 mL/min (ref >60)
Glucose, Bld: 123 mg/dL — ABNORMAL HIGH (ref 70–99)
Potassium: 4.3 mmol/L (ref 3.5–5.1)
Sodium: 133 mmol/L — ABNORMAL LOW (ref 135–145)

## 2023-09-11 MED ORDER — RIVAROXABAN 10 MG PO TABS
10.0000 mg | ORAL_TABLET | Freq: Every day | ORAL | 0 refills | Status: AC
  Filled 2023-09-11: qty 20, 20d supply, fill #0

## 2023-09-11 MED ORDER — METHOCARBAMOL 500 MG PO TABS
500.0000 mg | ORAL_TABLET | Freq: Four times a day (QID) | ORAL | 0 refills | Status: AC | PRN
  Filled 2023-09-11: qty 40, 10d supply, fill #0

## 2023-09-11 MED ORDER — TRAMADOL HCL 50 MG PO TABS
50.0000 mg | ORAL_TABLET | Freq: Four times a day (QID) | ORAL | 0 refills | Status: AC | PRN
  Filled 2023-09-11: qty 40, 10d supply, fill #0

## 2023-09-11 MED ORDER — OXYCODONE HCL 5 MG PO TABS
5.0000 mg | ORAL_TABLET | Freq: Four times a day (QID) | ORAL | 0 refills | Status: AC | PRN
  Filled 2023-09-11: qty 42, 6d supply, fill #0

## 2023-09-11 MED ORDER — ONDANSETRON HCL 4 MG PO TABS
4.0000 mg | ORAL_TABLET | Freq: Four times a day (QID) | ORAL | 0 refills | Status: AC | PRN
  Filled 2023-09-11: qty 20, 5d supply, fill #0

## 2023-09-11 NOTE — TOC Initial Note (Signed)
 Transition of Care [facility]) - Initial/Assessment Note    Patient Details  Name: Fred Perkins MRN: [id number] Date of Birth: July 25, 1945  Transition of Care [facility]) CM/SW Contact:    Levie Ream, RN Phone Number: 09/11/2023, 11:04 AM  Clinical Narrative:                 Spoke w/ pt and wife Ha Placeres [phone]) in room; pt says he lives at home; he plans to return at d/c; his wife will provide transportation; pt verified insurance/PCP; he denied SDOH risks; pt says he has cane; RW has been delivered to room; pt says he does not have HH services, or home oxygen; no TOC needs.  Expected Discharge Plan: Home/Self Care Barriers to Discharge: No Barriers Identified   Patient Goals and CMS Choice Patient states their goals for this hospitalization and ongoing recovery are:: home CMS Medicare.gov Compare Post Acute Care list provided to:: Patient        Expected Discharge Plan and Services       Living arrangements for the past 2 months: Single Family Home Expected Discharge Date: 09/11/23               DME Arranged: Otho Blitz rolling DME Agency: [employer]       HH Arranged: NA HH Agency: NA        Prior Living Arrangements/Services Living arrangements for the past 2 months: Single Family Home Lives with:: Spouse Patient language and need for interpreter reviewed:: Yes Do you feel safe going back to the place where you live?: Yes      Need for Family Participation in Patient Care: Yes (Comment) Care giver support system in place?: Yes (comment) Current home services: DME (cane) Criminal Activity/Legal Involvement Pertinent to Current Situation/Hospitalization: No - Comment as needed  Activities of Daily Living   ADL Screening (condition at time of admission) Independently performs ADLs?: Yes (appropriate for developmental age) Is the patient deaf or have difficulty hearing?: No Does the patient have difficulty seeing, even when wearing glasses/contacts?:  No Does the patient have difficulty concentrating, remembering, or making decisions?: No  Permission Sought/Granted Permission sought to share information with : Case Manager Permission granted to share information with : Yes, Verbal Permission Granted  Share Information with NAME: Case Manager     Permission granted to share info w Relationship: Marilou Showman     Emotional Assessment Appearance:: Appears stated age Attitude/Demeanor/Rapport: Gracious Affect (typically observed): Accepting Orientation: : Oriented to Self, Oriented to Place, Oriented to  Time, Oriented to Situation Alcohol / Substance Use: Not Applicable Psych Involvement: No (comment)  Admission diagnosis:  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 [place] [address] Phone: [phone] Fax: [phone]  [place] - [facility] [address] [place] [address] Phone: [phone] Fax: [phone]     Social Drivers of Health (SDOH) Social History: SDOH Screenings   Food Insecurity: No Food Insecurity (  09/11/2023)  Housing: Low Risk  (09/11/2023)  Transportation Needs: No Transportation Needs (09/11/2023)  Utilities: Not At Risk (09/11/2023)  Financial Resource Strain: Low Risk  (07/03/2022)   Received from [facility]  Social Connections: Socially Integrated (09/11/2023)  Tobacco Use: Low Risk   (09/10/2023)   SDOH Interventions: Food Insecurity Interventions: Intervention Not Indicated, Inpatient TOC Housing Interventions: Intervention Not Indicated, Inpatient TOC Transportation Interventions: Intervention Not Indicated, Inpatient TOC Utilities Interventions: Intervention Not Indicated, Inpatient TOC   Readmission Risk Interventions     No data to display

## 2023-09-11 NOTE — Care Management Obs Status (Signed)
 MEDICARE OBSERVATION STATUS NOTIFICATION   Patient Details  Name: Fred Perkins MRN: [id number] Date of Birth: [birth year]   Medicare Observation Status Notification Given:  Yes    Levie Ream, RN 09/11/2023, 10:11 AM

## 2023-09-11 NOTE — Progress Notes (Signed)
   Subjective: 1 Day Post-Op Procedure(s) (LRB): ARTHROPLASTY, HIP, TOTAL, ANTERIOR APPROACH (Right) Patient seen in rounds by Dr. France Ina. Patient is well, and has had no acute complaints or problems. Denies SOB or chest pain. Denies calf pain. Patient reports pain as moderate. Worked with physical therapy yesterday and ambulated 50'. We will continue physical therapy today.   Objective: Vital signs in last 24 hours: Temp:  [97.5 F (36.4 C)-99.2 F (37.3 C)] 99 F (37.2 C) (06/05 0501) Pulse Rate:  [57-92] 92 (06/05 0501) Resp:  [12-18] 17 (06/05 0501) BP: (121-155)/(56-92) 155/73 (06/05 0501) SpO2:  [95 %-100 %] 96 % (06/05 0501) Weight:  [69.2 kg] 69.2 kg (06/05 0159)  Intake/Output from previous day:  Intake/Output Summary (Last 24 hours) at 09/11/2023 0806 Last data filed at 09/11/2023 0715 Gross per 24 hour  Intake 2790.15 ml  Output 1300 ml  Net 1490.15 ml     Intake/Output this shift: Total I/O In: -  Out: 200 [Urine:200]  Labs: Recent Labs    09/11/23 0352  HGB 11.3*   Recent Labs    09/11/23 0352  WBC 15.6*  RBC 3.75*  HCT 34.9*  PLT 265   Recent Labs    09/11/23 0352  NA 133*  K 4.3  CL 101  CO2 25  BUN 9  CREATININE 0.86  GLUCOSE 123*  CALCIUM 8.5*   No results for input(s): "LABPT", "INR" in the last 72 hours.  Exam: General - Patient is Alert and Oriented Extremity - Neurologically intact Neurovascular intact Sensation intact distally Dorsiflexion/Plantar flexion intact Dressing - dressing C/D/I Motor Function - intact, moving foot and toes well on exam.  Past Medical History:  Diagnosis Date   Arthritis    BPH (benign prostatic hyperplasia)    Coronary artery disease    H/O heart artery stent    Heart disease    High cholesterol    RBBB    Skin cancer, basal cell    Squamous cell skin cancer     Assessment/Plan: 1 Day Post-Op Procedure(s) (LRB): ARTHROPLASTY, HIP, TOTAL, ANTERIOR APPROACH (Right) Principal Problem:    Osteoarthritis of right hip  Estimated body mass index is 23.89 kg/m as calculated from the following:   Height as of this encounter: 5\' 7"  (1.702 m).   Weight as of this encounter: 69.2 kg. Advance diet Up with therapy D/C IV fluids  DVT Prophylaxis - Xarelto Weight bearing as tolerated.  Continue physical therapy. Expected discharge home today pending progress with physical therapy and if meeting patient goals. Will do HEP once discharged. Follow-up in clinic in 2 weeks.  The PDMP database was reviewed today prior to any opioid medications being prescribed to this patient.  R. Brinton Canavan, PA-C Orthopedic Surgery 09/11/2023, 8:06 AM

## 2023-09-11 NOTE — Progress Notes (Signed)
 Patient ambulated in hall at hs.

## 2023-09-11 NOTE — Progress Notes (Signed)
 Physical Therapy Treatment Patient Details Name: Fred Perkins MRN: [id number] DOB: Jul 29, 1945 Today's Date: 09/11/2023   History of Present Illness 78 yo male presents to therapy s/p R THA, anterior approach on 09/10/2023 due to failure of conservative measures. Pt PMH includes but is not limited to: anemia, GI bleed, arthritis, HLD, CAD s/p stent placement and back surgery.    PT Comments  POD # 1 am session Pt AxO x 3 pleasant and motivated to D/C to home with Spouse today. Pt was OOB in recliner.  Assisted with amb a functional distance in hallway.  Then returned to room to perform some TE's following HEP handout.  Instructed on proper tech, freq as well as use of ICE.   Addressed all mobility questions, discussed appropriate activity, educated on use of ICE.  Pt ready for D/C to home.    If plan is discharge home, recommend the following: A little help with walking and/or transfers;A little help with bathing/dressing/bathroom;Assistance with cooking/housework;Assist for transportation;Help with stairs or ramp for entrance   Can travel by private vehicle        Equipment Recommendations  Rolling walker (2 wheels)    Recommendations for Other Services       Precautions / Restrictions Precautions Precautions: Fall Restrictions Weight Bearing Restrictions Per Provider Order: No     Mobility  Bed Mobility               General bed mobility comments: OOB in recliner    Transfers Overall transfer level: Needs assistance Equipment used: Rolling walker (2 wheels) Transfers: Sit to/from Stand Sit to Stand: Supervision, Contact guard assist           General transfer comment: one VC safety with turns    Ambulation/Gait Ambulation/Gait assistance: Supervision, Contact guard assist Gait Distance (Feet): 75 Feet   Gait Pattern/deviations: Step-to pattern, Antalgic, Trunk flexed, Decreased stance time - right Gait velocity: decreased     General Gait Details:  tolerated a functional distance   Stairs             Wheelchair Mobility     Tilt Bed    Modified Rankin (Stroke Patients Only)       Balance                                            Communication Communication Communication: No apparent difficulties  Cognition   Behavior During Therapy: WFL for tasks assessed/performed   PT - Cognitive impairments: No apparent impairments                       PT - Cognition Comments: AxO x 3 pleasant and motivated Following commands: Intact      Cueing Cueing Techniques: Verbal cues  Exercises  Total Hip Replacement TE's following HEP Handout 10 reps ankle pumps 05 reps knee presses 05 reps heel slides 05 reps SAQ's 05 reps ABD Instructed how to use a belt loop to assist  Followed by ICE     General Comments        Pertinent Vitals/Pain Pain Assessment Pain Assessment: 0-10 Pain Score: 4  Pain Location: R hip and LE Pain Descriptors / Indicators: Aching, Operative site guarding, Tender Pain Intervention(s): Monitored during session, Premedicated before session, Repositioned, Ice applied    Home Living  Prior Function            PT Goals (current goals can now be found in the care plan section) Progress towards PT goals: Progressing toward goals    Frequency    7X/week      PT Plan      Co-evaluation              AM-PAC PT "6 Clicks" Mobility   Outcome Measure  Help needed turning from your back to your side while in a flat bed without using bedrails?: None Help needed moving from lying on your back to sitting on the side of a flat bed without using bedrails?: None Help needed moving to and from a bed to a chair (including a wheelchair)?: None Help needed standing up from a chair using your arms (e.g., wheelchair or bedside chair)?: None Help needed to walk in hospital room?: None Help needed climbing 3-5 steps with a  railing? : A Little 6 Click Score: 23    End of Session Equipment Utilized During Treatment: Gait belt Activity Tolerance: Patient tolerated treatment well Patient left: in chair;with family/visitor present Nurse Communication: Mobility status PT Visit Diagnosis: Unsteadiness on feet (R26.81);Other abnormalities of gait and mobility (R26.89);Muscle weakness (generalized) (M62.81);Difficulty in walking, not elsewhere classified (R26.2);Pain Pain - Right/Left: Right Pain - part of body: Hip;Leg     Time: 3244-0102 PT Time Calculation (min) (ACUTE ONLY): 23 min  Charges:    $Gait Training: 8-22 mins $Therapeutic Exercise: 8-22 mins PT General Charges $$ ACUTE PT VISIT: 1 Visit                    Bess Broody  PTA Acute  Rehabilitation Services Office M-F          [phone]

## 2023-09-12 NOTE — Discharge Summary (Signed)
 Physician Discharge Summary   Patient ID: Fred Perkins MRN: [id number] DOB/AGE: 78-08-1945 77 y.o.  Admit date: 09/10/2023 Discharge date: 09/11/2023  Primary Diagnosis: Osteoarthritis of the right hip   Admission Diagnoses:  Past Medical History:  Diagnosis Date   Arthritis    BPH (benign prostatic hyperplasia)    Coronary artery disease    H/O heart artery stent    Heart disease    High cholesterol    RBBB    Skin cancer, basal cell    Squamous cell skin cancer    Discharge Diagnoses:   Principal Problem:   Osteoarthritis of right hip  Estimated body mass index is 23.89 kg/m as calculated from the following:   Height as of this encounter: 5\' 7"  (1.702 m).   Weight as of this encounter: 69.2 kg.  Procedure:  Procedure(s) (LRB): ARTHROPLASTY, HIP, TOTAL, ANTERIOR APPROACH (Right)   Consults: None  HPI: Fred Perkins is a 78 y.o. male who has advanced end-  stage arthritis of their Right  hip with progressively worsening pain and dysfunction.The patient has failed nonoperative management and presents for total hip arthroplasty.  Laboratory Data: Admission on 09/10/2023, Discharged on 09/11/2023  Component Date Value Ref Range Status   WBC 09/11/2023 15.6 (H)  4.0 - 10.5 K/uL Final   RBC 09/11/2023 3.75 (L)  4.22 - 5.81 MIL/uL Final   Hemoglobin 09/11/2023 11.3 (L)  13.0 - 17.0 g/dL Final   HCT 40/98/1191 34.9 (L)  39.0 - 52.0 % Final   MCV 09/11/2023 93.1  80.0 - 100.0 fL Final   MCH 09/11/2023 30.1  26.0 - 34.0 pg Final   MCHC 09/11/2023 32.4  30.0 - 36.0 g/dL Final   RDW 47/82/9562 14.1  11.5 - 15.5 % Final   Platelets 09/11/2023 265  150 - 400 K/uL Final   nRBC 09/11/2023 0.0  0.0 - 0.2 % Final   Performed at [facility], [address], [place], [place] [address]   Sodium 09/11/2023 133 (L)  135 - 145 mmol/L Final   Potassium 09/11/2023 4.3  3.5 - 5.1 mmol/L Final   Chloride 09/11/2023 101  98 - 111 mmol/L Final   CO2 09/11/2023 25  22  - 32 mmol/L Final   Glucose, Bld 09/11/2023 123 (H)  70 - 99 mg/dL Final   Glucose reference range applies only to samples taken after fasting for at least 8 hours.   BUN 09/11/2023 9  8 - 23 mg/dL Final   Creatinine, Ser 09/11/2023 0.86  0.61 - 1.24 mg/dL Final   Calcium 57/84/6962 8.5 (L)  8.9 - 10.3 mg/dL Final   GFR, Estimated 09/11/2023 >60  >60 mL/min Final   Comment: (NOTE) Calculated using the CKD-EPI Creatinine Equation (2021)    Anion gap 09/11/2023 7  5 - 15 Final   Performed at [facility], [address] [place], [place] [address]   ABO/RH(D) 09/10/2023    Final                   Value:A POS Performed at [facility], [address], [place], [place] [address]   Hospital Outpatient Visit on 08/28/2023  Component Date Value Ref Range Status   MRSA, PCR 08/28/2023 NEGATIVE  NEGATIVE Final   Staphylococcus aureus 08/28/2023 NEGATIVE  NEGATIVE Final   Comment: (NOTE) The Xpert SA Assay (FDA approved for NASAL specimens in patients 4 years of age and older), is one component of a comprehensive surveillance  program. It is not intended to diagnose infection nor to guide or monitor treatment. Performed at [facility], [address], [place], [place] [address]    WBC 08/28/2023 7.7  4.0 - 10.5 K/uL Final   RBC 08/28/2023 4.20 (L)  4.22 - 5.81 MIL/uL Final   Hemoglobin 08/28/2023 12.6 (L)  13.0 - 17.0 g/dL Final   HCT 57/84/6962 39.0  39.0 - 52.0 % Final   MCV 08/28/2023 92.9  80.0 - 100.0 fL Final   MCH 08/28/2023 30.0  26.0 - 34.0 pg Final   MCHC 08/28/2023 32.3  30.0 - 36.0 g/dL Final   RDW 95/28/4132 14.3  11.5 - 15.5 % Final   Platelets 08/28/2023 292  150 - 400 K/uL Final   nRBC 08/28/2023 0.0  0.0 - 0.2 % Final   Performed at [facility], [address], [place], [place] [address]   ABO/RH(D) 08/28/2023 A POS   Final   Antibody Screen 08/28/2023 NEG   Final   Sample Expiration  08/28/2023 09/11/2023,2359   Final   Extend sample reason 08/28/2023    Final                   Value:NO TRANSFUSIONS OR PREGNANCY IN THE PAST 3 MONTHS Performed at [facility], [address], [place], [place] [address]    Sodium 08/28/2023 136  135 - 145 mmol/L Final   Potassium 08/28/2023 5.2 (H)  3.5 - 5.1 mmol/L Final   SLIGHT HEMOLYSIS   Chloride 08/28/2023 102  98 - 111 mmol/L Final   CO2 08/28/2023 27  22 - 32 mmol/L Final   Glucose, Bld 08/28/2023 87  70 - 99 mg/dL Final   Glucose reference range applies only to samples taken after fasting for at least 8 hours.   BUN 08/28/2023 14  8 - 23 mg/dL Final   Creatinine, Ser 08/28/2023 1.07  0.61 - 1.24 mg/dL Final   Calcium 66/44/0347 9.1  8.9 - 10.3 mg/dL Final   GFR, Estimated 08/28/2023 >60  >60 mL/min Final   Comment: (NOTE) Calculated using the CKD-EPI Creatinine Equation (2021)    Anion gap 08/28/2023 7  5 - 15 Final   Performed at [facility], [address] [place], [place] [address]     X-Rays:DG Pelvis Portable Result Date: 09/10/2023 CLINICAL DATA:  Status post right hip arthroplasty. EXAM: PORTABLE PELVIS 1-2 VIEWS COMPARISON:  None Available. FINDINGS: Right hip arthroplasty in expected alignment. No periprosthetic lucency or fracture. Recent postsurgical change includes air and edema in the soft tissues. Advanced left hip arthropathy. IMPRESSION: Right hip arthroplasty without immediate postoperative complication. Electronically Signed   By: Chadwick Colonel M.D.   On: 09/10/2023 15:06   DG HIP UNILAT WITH PELVIS 1V RIGHT Result Date: 09/10/2023 CLINICAL DATA:  Right total hip arthroplasty. EXAM: DG HIP (WITH OR WITHOUT PELVIS) 1V RIGHT; DG C-ARM 1-60 MIN-NO REPORT Radiation exposure index: 0.9562 mGy. COMPARISON:  None Available. FINDINGS: Two intraoperative fluoroscopic images were obtained of the right hip. Right femoral and acetabular components are well situated. IMPRESSION:  Fluoroscopic guidance provided during right total hip arthroplasty. Electronically Signed   By: Rosalene Colon M.D.   On: 09/10/2023 14:25   DG C-Arm 1-60 Min-No Report Result Date: 09/10/2023 Fluoroscopy was utilized by the requesting physician.  No radiographic interpretation.   DG C-Arm 1-60 Min-No Report Result Date: 09/10/2023 Fluoroscopy was utilized by the requesting physician.  No radiographic interpretation.  EKG: Orders placed or performed during the hospital encounter of 08/28/23   EKG 12 lead per protocol   EKG 12 lead per protocol     Hospital Course: Fred Perkins is a 78 y.o. who was admitted to [facility]. They were brought to the operating room on 09/10/2023 and underwent Procedure(s): ARTHROPLASTY, HIP, TOTAL, ANTERIOR APPROACH.  Patient tolerated the procedure well and was later transferred to the recovery room and then to the orthopaedic floor for postoperative care. They were given PO and IV analgesics for pain control following their surgery. They were given 24 hours of postoperative antibiotics of  Anti-infectives (From admission, onward)    Start     Dose/Rate Route Frequency Ordered Stop   09/10/23 1830  ceFAZolin (ANCEF) IVPB 2g/100 mL premix        2 g 200 mL/hr over 30 Minutes Intravenous Every 6 hours 09/10/23 1544 09/11/23 0003   09/10/23 1000  ceFAZolin (ANCEF) IVPB 2g/100 mL premix        2 g 200 mL/hr over 30 Minutes Intravenous On call to O.R. 09/10/23 1610 09/10/23 1221      and started on DVT prophylaxis in the form of Xarelto.   PT and OT were ordered for total joint protocol. Discharge planning consulted to help with postop disposition and equipment needs.  Patient had a fair night on the evening of surgery. They started to get up OOB with therapy on POD #0. Pt was seen during rounds and was ready to go home pending progress with therapy. He worked with therapy on POD #1 and was meeting his goals. Pt was discharged to home later that day in  stable condition.  Diet: Regular diet Activity: WBAT Follow-up: in 2 weeks Disposition: Home Discharged Condition: stable   Discharge Instructions     Call MD / Call 911   Complete by: As directed    If you experience chest pain or shortness of breath, CALL 911 and be transported to the hospital emergency room.  If you develope a fever above 101 F, pus (white drainage) or increased drainage or redness at the wound, or calf pain, call your surgeon's office.   Change dressing   Complete by: As directed    You have an adhesive waterproof bandage over the incision. Leave this in place until your first follow-up appointment. Once you remove this you will not need to place another bandage.   Constipation Prevention   Complete by: As directed    Drink plenty of fluids.  Prune juice may be helpful.  You may use a stool softener, such as Colace (over the counter) 100 mg twice a day.  Use MiraLax (over the counter) for constipation as needed.   Diet - low sodium heart healthy   Complete by: As directed    Do not sit on low chairs, stoools or toilet seats, as it may be difficult to get up from low surfaces   Complete by: As directed    Driving restrictions   Complete by: As directed    No driving for two weeks   Post-operative opioid taper instructions:   Complete by: As directed    POST-OPERATIVE OPIOID TAPER INSTRUCTIONS: It is important to wean off of your opioid medication as soon as possible. If you do not need pain medication after your surgery it is ok to stop day one. Opioids include: Codeine, Hydrocodone(Norco, Vicodin), Oxycodone (Percocet, oxycontin ) and hydromorphone amongst others.  Long term and even short term use of  opiods can cause: Increased pain response Dependence Constipation Depression Respiratory depression And more.  Withdrawal symptoms can include Flu like symptoms Nausea, vomiting And more Techniques to manage these symptoms Hydrate well Eat regular healthy  meals Stay active Use relaxation techniques(deep breathing, meditating, yoga) Do Not substitute Alcohol to help with tapering If you have been on opioids for less than two weeks and do not have pain than it is ok to stop all together.  Plan to wean off of opioids This plan should start within one week post op of your joint replacement. Maintain the same interval or time between taking each dose and first decrease the dose.  Cut the total daily intake of opioids by one tablet each day Next start to increase the time between doses. The last dose that should be eliminated is the evening dose.      TED hose   Complete by: As directed    Use stockings (TED hose) for three weeks on both leg(s).  You may remove them at night for sleeping.   Weight bearing as tolerated   Complete by: As directed       Allergies as of 09/11/2023       Reactions   Diclofenac Nausea And Vomiting   Meloxicam Nausea And Vomiting   Sudafed [pseudoephedrine] Other (See Comments)   Stomach irritation    Nsaids Nausea And Vomiting        Medication List     TAKE these medications    acetaminophen  325 MG tablet Commonly known as: TYLENOL  Take 325 mg by mouth every 6 (six) hours as needed for moderate pain (pain score 4-6).   B-12 1000 MCG Subl Place 1,000 mcg under the tongue daily.   folic acid  1 MG tablet Commonly known as: FOLVITE  Take 1 tablet (1 mg total) by mouth daily.   methocarbamol 500 MG tablet Commonly known as: ROBAXIN Take 1 tablet (500 mg total) by mouth every 6 (six) hours as needed for muscle spasms.   multivitamin with minerals Tabs tablet Take 1 tablet by mouth daily.   omeprazole  40 MG capsule Commonly known as: PRILOSEC Take 40 mg by mouth 2 (two) times daily before a meal.   ondansetron  4 MG tablet Commonly known as: ZOFRAN  Take 1 tablet (4 mg total) by mouth every 6 (six) hours as needed for nausea.   oxyCODONE  5 MG immediate release tablet Commonly known as: Oxy  IR/ROXICODONE  Take 1-2 tablets (5-10 mg total) by mouth every 6 (six) hours as needed for severe pain (pain score 7-10).   simvastatin  40 MG tablet Commonly known as: ZOCOR  Take 40 mg by mouth at bedtime.   tamsulosin  0.4 MG Caps capsule Commonly known as: FLOMAX  Take 0.4 mg by mouth daily.   traMADol  50 MG tablet Commonly known as: ULTRAM  Take 1 tablet (50 mg total) by mouth every 6 (six) hours as needed for moderate pain (pain score 4-6). What changed: reasons to take this   Xarelto 10 MG Tabs tablet Generic drug: rivaroxaban Take 1 tablet (10 mg total) by mouth daily with breakfast for 20 days.               Discharge Care Instructions  (From admission, onward)           Start     Ordered   09/11/23 0000  Weight bearing as tolerated        09/11/23 0808   09/11/23 0000  Change dressing       Comments:  You have an adhesive waterproof bandage over the incision. Leave this in place until your first follow-up appointment. Once you remove this you will not need to place another bandage.   09/11/23 1610            Follow-up Information     Liliane Rei, MD. Go on 09/23/2023.   Specialty: Orthopedic Surgery Why: You are scheduled for a post op appointment on Tuesday 09/23/23 at 1:30pm Contact information: [address] [place] [address] [phone]                 Signed: R. Brinton Canavan, PA-C Orthopedic Surgery 09/12/2023, 10:26 AM

## 2023-09-15 ENCOUNTER — Other Ambulatory Visit (HOSPITAL_COMMUNITY): Payer: Self-pay

## 2023-10-13 DIAGNOSIS — D2272 Melanocytic nevi of left lower limb, including hip: Secondary | ICD-10-CM | POA: Diagnosis not present

## 2023-10-13 DIAGNOSIS — D044 Carcinoma in situ of skin of scalp and neck: Secondary | ICD-10-CM | POA: Diagnosis not present

## 2023-10-13 DIAGNOSIS — L218 Other seborrheic dermatitis: Secondary | ICD-10-CM | POA: Diagnosis not present

## 2023-10-13 DIAGNOSIS — D2261 Melanocytic nevi of right upper limb, including shoulder: Secondary | ICD-10-CM | POA: Diagnosis not present

## 2023-10-13 DIAGNOSIS — L570 Actinic keratosis: Secondary | ICD-10-CM | POA: Diagnosis not present

## 2023-10-13 DIAGNOSIS — D225 Melanocytic nevi of trunk: Secondary | ICD-10-CM | POA: Diagnosis not present

## 2023-10-13 DIAGNOSIS — D485 Neoplasm of uncertain behavior of skin: Secondary | ICD-10-CM | POA: Diagnosis not present

## 2023-10-13 DIAGNOSIS — L814 Other melanin hyperpigmentation: Secondary | ICD-10-CM | POA: Diagnosis not present

## 2023-10-13 DIAGNOSIS — D2262 Melanocytic nevi of left upper limb, including shoulder: Secondary | ICD-10-CM | POA: Diagnosis not present

## 2023-10-13 DIAGNOSIS — Z85828 Personal history of other malignant neoplasm of skin: Secondary | ICD-10-CM | POA: Diagnosis not present

## 2023-10-16 DIAGNOSIS — M25551 Pain in right hip: Secondary | ICD-10-CM | POA: Diagnosis not present

## 2023-10-16 DIAGNOSIS — Z5189 Encounter for other specified aftercare: Secondary | ICD-10-CM | POA: Diagnosis not present

## 2023-10-16 DIAGNOSIS — M1612 Unilateral primary osteoarthritis, left hip: Secondary | ICD-10-CM | POA: Diagnosis not present

## 2023-10-31 DIAGNOSIS — Z79899 Other long term (current) drug therapy: Secondary | ICD-10-CM | POA: Diagnosis not present

## 2023-10-31 DIAGNOSIS — I2510 Atherosclerotic heart disease of native coronary artery without angina pectoris: Secondary | ICD-10-CM | POA: Diagnosis not present

## 2023-10-31 DIAGNOSIS — Z01810 Encounter for preprocedural cardiovascular examination: Secondary | ICD-10-CM | POA: Diagnosis not present

## 2023-12-22 DIAGNOSIS — D044 Carcinoma in situ of skin of scalp and neck: Secondary | ICD-10-CM | POA: Diagnosis not present

## 2024-01-21 ENCOUNTER — Ambulatory Visit (HOSPITAL_COMMUNITY): Admit: 2024-01-21 | Admitting: Orthopedic Surgery

## 2024-01-21 SURGERY — ARTHROPLASTY, HIP, TOTAL, ANTERIOR APPROACH
Anesthesia: Choice | Site: Hip | Laterality: Left

## 2024-02-16 DIAGNOSIS — R739 Hyperglycemia, unspecified: Secondary | ICD-10-CM | POA: Diagnosis not present

## 2024-02-16 DIAGNOSIS — Z125 Encounter for screening for malignant neoplasm of prostate: Secondary | ICD-10-CM | POA: Diagnosis not present

## 2024-02-16 DIAGNOSIS — D500 Iron deficiency anemia secondary to blood loss (chronic): Secondary | ICD-10-CM | POA: Diagnosis not present

## 2024-02-16 DIAGNOSIS — E782 Mixed hyperlipidemia: Secondary | ICD-10-CM | POA: Diagnosis not present

## 2024-02-16 DIAGNOSIS — D510 Vitamin B12 deficiency anemia due to intrinsic factor deficiency: Secondary | ICD-10-CM | POA: Diagnosis not present

## 2024-02-20 DIAGNOSIS — Z0000 Encounter for general adult medical examination without abnormal findings: Secondary | ICD-10-CM | POA: Diagnosis not present

## 2024-02-20 DIAGNOSIS — E782 Mixed hyperlipidemia: Secondary | ICD-10-CM | POA: Diagnosis not present

## 2024-02-20 DIAGNOSIS — Z1331 Encounter for screening for depression: Secondary | ICD-10-CM | POA: Diagnosis not present

## 2024-02-20 DIAGNOSIS — D510 Vitamin B12 deficiency anemia due to intrinsic factor deficiency: Secondary | ICD-10-CM | POA: Diagnosis not present

## 2024-03-15 DIAGNOSIS — H35373 Puckering of macula, bilateral: Secondary | ICD-10-CM | POA: Diagnosis not present
